# Patient Record
Sex: Female | Born: 1960 | Race: White | Hispanic: No | Marital: Single | Smoking: Former smoker
Health system: Southern US, Community
[De-identification: ages and names within clinical notes are randomized; demographics above are authoritative.]

## PROBLEM LIST (undated history)

## (undated) DIAGNOSIS — Z789 Other specified health status: Secondary | ICD-10-CM

## (undated) HISTORY — PX: BREAST LUMPECTOMY: SHX2

## (undated) HISTORY — PX: BREAST ENHANCEMENT SURGERY: SHX7

## (undated) HISTORY — DX: Other specified health status: Z78.9

## (undated) HISTORY — PX: TONSILLECTOMY: SUR1361

## (undated) HISTORY — PX: TUBAL LIGATION: SHX77

---

## 1999-12-27 HISTORY — PX: AUGMENTATION MAMMAPLASTY: SUR837

## 1999-12-27 HISTORY — PX: BREAST LUMPECTOMY: SHX2

## 2014-12-02 LAB — LAB REPORT - SCANNED
EGFR: 100
Free T4: 0.97 ng/dL

## 2015-09-18 LAB — HM PAP SMEAR: HM Pap smear: NORMAL

## 2015-11-06 LAB — HM MAMMOGRAPHY

## 2017-11-01 ENCOUNTER — Ambulatory Visit (INDEPENDENT_AMBULATORY_CARE_PROVIDER_SITE_OTHER): Payer: BLUE CROSS/BLUE SHIELD | Admitting: Physician Assistant

## 2017-11-01 ENCOUNTER — Encounter: Payer: Self-pay | Admitting: Physician Assistant

## 2017-11-01 VITALS — BP 110/64 | HR 68 | Temp 98.5°F | Resp 16 | Wt 142.0 lb

## 2017-11-01 DIAGNOSIS — Z13220 Encounter for screening for lipoid disorders: Secondary | ICD-10-CM | POA: Diagnosis not present

## 2017-11-01 DIAGNOSIS — Z131 Encounter for screening for diabetes mellitus: Secondary | ICD-10-CM

## 2017-11-01 DIAGNOSIS — Z9889 Other specified postprocedural states: Secondary | ICD-10-CM

## 2017-11-01 DIAGNOSIS — Z862 Personal history of diseases of the blood and blood-forming organs and certain disorders involving the immune mechanism: Secondary | ICD-10-CM

## 2017-11-01 DIAGNOSIS — Z23 Encounter for immunization: Secondary | ICD-10-CM | POA: Diagnosis not present

## 2017-11-01 DIAGNOSIS — Z0000 Encounter for general adult medical examination without abnormal findings: Secondary | ICD-10-CM

## 2017-11-01 DIAGNOSIS — Z1239 Encounter for other screening for malignant neoplasm of breast: Secondary | ICD-10-CM

## 2017-11-01 DIAGNOSIS — Z1231 Encounter for screening mammogram for malignant neoplasm of breast: Secondary | ICD-10-CM | POA: Diagnosis not present

## 2017-11-01 DIAGNOSIS — E079 Disorder of thyroid, unspecified: Secondary | ICD-10-CM

## 2017-11-01 LAB — CBC WITH DIFFERENTIAL/PLATELET
Basophils Absolute: 29 cells/uL (ref 0–200)
Basophils Relative: 0.6 %
Eosinophils Absolute: 172 cells/uL (ref 15–500)
Eosinophils Relative: 3.5 %
HCT: 38.4 % (ref 35.0–45.0)
Hemoglobin: 12.6 g/dL (ref 11.7–15.5)
Lymphs Abs: 1401 cells/uL (ref 850–3900)
MCH: 29.8 pg (ref 27.0–33.0)
MCHC: 32.8 g/dL (ref 32.0–36.0)
MCV: 90.8 fL (ref 80.0–100.0)
MPV: 10 fL (ref 7.5–12.5)
Monocytes Relative: 8.6 %
Neutro Abs: 2876 cells/uL (ref 1500–7800)
Neutrophils Relative %: 58.7 %
Platelets: 266 10*3/uL (ref 140–400)
RBC: 4.23 10*6/uL (ref 3.80–5.10)
RDW: 12 % (ref 11.0–15.0)
Total Lymphocyte: 28.6 %
WBC mixed population: 421 cells/uL (ref 200–950)
WBC: 4.9 10*3/uL (ref 3.8–10.8)

## 2017-11-01 LAB — COMPLETE METABOLIC PANEL WITH GFR
AG Ratio: 1.9 (calc) (ref 1.0–2.5)
ALT: 35 U/L — ABNORMAL HIGH (ref 6–29)
AST: 27 U/L (ref 10–35)
Albumin: 4.1 g/dL (ref 3.6–5.1)
Alkaline phosphatase (APISO): 72 U/L (ref 33–130)
BUN: 17 mg/dL (ref 7–25)
CO2: 30 mmol/L (ref 20–32)
Calcium: 9.5 mg/dL (ref 8.6–10.4)
Chloride: 105 mmol/L (ref 98–110)
Creat: 0.6 mg/dL (ref 0.50–1.05)
GFR, Est African American: 116 mL/min/{1.73_m2} (ref >=60)
GFR, Est Non African American: 100 mL/min/{1.73_m2} (ref >=60)
Globulin: 2.2 g/dL (calc) (ref 1.9–3.7)
Glucose, Bld: 77 mg/dL (ref 65–99)
Potassium: 4 mmol/L (ref 3.5–5.3)
Sodium: 141 mmol/L (ref 135–146)
Total Bilirubin: 0.3 mg/dL (ref 0.2–1.2)
Total Protein: 6.3 g/dL (ref 6.1–8.1)

## 2017-11-01 LAB — LIPID PANEL
Cholesterol: 181 mg/dL (ref <200)
HDL: 65 mg/dL (ref >50)
LDL Cholesterol (Calc): 99 mg/dL (calc)
Non-HDL Cholesterol (Calc): 116 mg/dL (calc) (ref <130)
Total CHOL/HDL Ratio: 2.8 (calc) (ref <5.0)
Triglycerides: 84 mg/dL (ref <150)

## 2017-11-01 LAB — TSH: TSH: 3.16 mIU/L (ref 0.40–4.50)

## 2017-11-01 NOTE — Patient Instructions (Signed)

## 2017-11-01 NOTE — Progress Notes (Signed)
Patient: Amanda Boyd, Female    DOB: Dec 16, 1961, 56 y.o.   MRN: [id number] Visit Date: 11/02/2017  Today's Provider: Trey Sailors, PA-C   Chief Complaint  Patient presents with  . Establish Care  . Annual Exam   Subjective:    Annual physical exam Amanda Boyd is a 56 y.o. female who presents today for health maintenance and complete physical. She feels fairly well. She reports exercising. She reports she is sleeping well.  She moved from [place] in the [facility] area. She is currently living in [place]. She has two dogs, a standard poodle and a bluetick hound.   She is not currently living with anybody. She is divorced, not sexually active. She has two sons and three grandkids.  She has a family history of breast cancer in her sister at age 20. No family history of colon cancer.   She reports having a lumpectomy in her left breast around 2001. She reports it was not cancerous, however she was advised to be on Tamoxifen. She reports she never went on Tamoxifen and got a second opinion, where her scans came back normal. She has been getting mammograms regularly since then with no findings.   Reports last colonoscopy was 2-3 years ago. Last pap 2 years ago. No history of abnormal PAP smear. History of tubal ligation.   She has received a flu shot. Says she has not had tetanus shot since early 2000's. -----------------------------------------------------------------   Review of Systems  Constitutional: Negative.   HENT: Negative.   Eyes: Negative.   Respiratory: Negative.   Cardiovascular: Negative.   Gastrointestinal: Negative.   Endocrine: Negative.   Genitourinary: Negative.   Musculoskeletal: Negative.   Skin: Negative.   Allergic/Immunologic: Negative.   Neurological: Negative.   Hematological: Negative.   Psychiatric/Behavioral: Negative.     Social History      She  reports that she quit smoking about 31 years ago. Her smoking use  included cigarettes. she has never used smokeless tobacco. She reports that she does not drink alcohol or use drugs.       Social History   Socioeconomic History  . Marital status: Single    Spouse name: None  . Number of children: None  . Years of education: None  . Highest education level: None  Social Needs  . Financial resource strain: None  . Food insecurity - worry: None  . Food insecurity - inability: None  . Transportation needs - medical: None  . Transportation needs - non-medical: None  Occupational History  . None  Tobacco Use  . Smoking status: Former Smoker    Types: Cigarettes    Last attempt to quit: 12/26/1985    Years since quitting: 31.8  . Smokeless tobacco: Never Used  Substance and Sexual Activity  . Alcohol use: No    Frequency: Never  . Drug use: No  . Sexual activity: No  Other Topics Concern  . None  Social History Narrative  . None    No past medical history on file.   There are no active problems to display for this patient.   Past Surgical History:  Procedure Laterality Date  . BREAST ENHANCEMENT SURGERY Bilateral   . BREAST LUMPECTOMY Left   . CESAREAN SECTION    . TUBAL LIGATION      Family History        Family Status  Relation Name Status  . Mother  Deceased at age 64  .  Father  Deceased  . Sister  Alive  . Emelda BrothersPat Aunt  Deceased        Her family history includes Breast cancer in her sister; COPD in her father; Diabetes in her paternal aunt.     Allergies  Allergen Reactions  . Morphine And Related      Current Outpatient Medications:  .  FLUARIX QUADRIVALENT 0.5 ML injection, inject 0.5 milliliter intramuscularly, Disp: , Rfl: 0   Patient Care Team: Maryella ShiversPollak, Lateia Fraser M, PA-C as PCP - General (Physician Assistant)      Objective:   Vitals: BP 110/64 (BP Location: Left Arm, Patient Position: Sitting, Cuff Size: Normal)   Pulse 68   Temp 98.5 F (36.9 C) (Oral)   Resp 16   Wt 142 lb (64.4 kg)    Vitals:    11/01/17 1432  BP: 110/64  Pulse: 68  Resp: 16  Temp: 98.5 F (36.9 C)  TempSrc: Oral  Weight: 142 lb (64.4 kg)     Physical Exam  Constitutional: She is oriented to person, place, and time. She appears well-developed and well-nourished.  HENT:  Right Ear: Tympanic membrane and external ear normal.  Left Ear: Tympanic membrane and external ear normal.  Mouth/Throat: Oropharynx is clear and moist. No oropharyngeal exudate.  Neck: Neck supple.  Cardiovascular: Normal rate and regular rhythm.  Pulmonary/Chest: Effort normal and breath sounds normal.  Abdominal: Soft. Bowel sounds are normal. She exhibits no distension. There is no tenderness.  Lymphadenopathy:    She has no cervical adenopathy.  Neurological: She is alert and oriented to person, place, and time.  Skin: Skin is warm and dry.  Psychiatric: She has a normal mood and affect. Her behavior is normal.     Depression Screen PHQ 2/9 Scores 11/01/2017  PHQ - 2 Score 0  PHQ- 9 Score 0      Assessment & Plan:     Routine Health Maintenance and Physical Exam  Exercise Activities and Dietary recommendations Goals    None      Immunization History  Administered Date(s) Administered  . Tdap 11/01/2017    Health Maintenance  Topic Date Due  . Hepatitis C Screening  1961/05/06  . HIV Screening  08/29/1974  . TETANUS/TDAP  08/29/1978  . PAP SMEAR  08/29/1980  . MAMMOGRAM  08/29/2009  . COLONOSCOPY  08/29/2009  . INFLUENZA VACCINE  07/26/2017     Discussed health benefits of physical activity, and encouraged her to engage in regular exercise appropriate for her age and condition.    1. Annual physical exam  Will need to get previous records, including colonoscopy/PAP.  - CBC with Differential - Ambulatory referral to Obstetrics / Gynecology  2. Breast cancer screening  She wants to be referred to GYN.  - MM Digital Screening; Future  3. Diabetes mellitus screening  - Comprehensive Metabolic  Panel (CMET)  4. Thyroid disorder  - TSH  5. Screening cholesterol level  - Lipid Profile  6. History of anemia   7. Need for influenza vaccination  Had flu shot recently.   8. Need for Tdap vaccination  - Tdap vaccine greater than or equal to 7yo IM  9. H/O lumpectomy  Will need to get records.  Return in about 1 year (around 11/01/2018) for CPE.  The entirety of the information documented in the History of Present Illness, Review of Systems and Physical Exam were personally obtained by me. Portions of this information were initially documented by Kavin LeechLaura Walsh, CMA and reviewed  by me for thoroughness and accuracy.    --------------------------------------------------------------------    Trey SailorsAdriana M Samaiyah Howes, PA-C  [facility]

## 2017-11-02 ENCOUNTER — Telehealth: Payer: Self-pay

## 2017-11-02 DIAGNOSIS — Z9889 Other specified postprocedural states: Secondary | ICD-10-CM | POA: Insufficient documentation

## 2017-11-02 NOTE — Telephone Encounter (Signed)
Pt advised.   Thanks,   -Laura  

## 2017-11-02 NOTE — Telephone Encounter (Signed)
-----   Message from Trey SailorsAdriana M Pollak, [place]PA-C sent at 11/02/2017  8:25 AM EST ----- Labs normal.

## 2017-11-08 ENCOUNTER — Encounter: Payer: Self-pay | Admitting: Maternal Newborn

## 2017-11-08 ENCOUNTER — Ambulatory Visit (INDEPENDENT_AMBULATORY_CARE_PROVIDER_SITE_OTHER): Payer: BLUE CROSS/BLUE SHIELD | Admitting: Maternal Newborn

## 2017-11-08 VITALS — BP 90/60 | HR 60 | Ht 67.0 in | Wt 145.0 lb

## 2017-11-08 DIAGNOSIS — Z124 Encounter for screening for malignant neoplasm of cervix: Secondary | ICD-10-CM | POA: Diagnosis not present

## 2017-11-08 DIAGNOSIS — Z01419 Encounter for gynecological examination (general) (routine) without abnormal findings: Secondary | ICD-10-CM

## 2017-11-08 NOTE — Progress Notes (Signed)
p   Gynecology Annual Exam  PCP: Trey SailorsPollak, Adriana M, PA-C  Chief Complaint:  Chief Complaint  Patient presents with  . Gynecologic Exam    LEAKS URINE C COUGHING/SNEEZING    History of Present Illness:Patient is a 56 y.o. G2P2002 presents for annual exam. The patient has no complaints today.   LMP: No LMP recorded. Patient is postmenopausal. Postmenopausal Bleeding: no Postcoital Bleeding: not applicable  The patient is not currently sexually active. She denies dyspareunia. The patient does not perform self breast exams. There is notable family history of breast or ovarian cancer in her family.  The patient wears seatbelts: yes.   The patient has regular exercise: yes.    The patient denies current symptoms of depression.      Review of Systems  Constitutional: Negative for chills, fever and malaise/fatigue.  HENT: Positive for congestion. Negative for ear pain, sinus pain and sore throat.        Seasonal allergies  Eyes: Negative.   Respiratory: Positive for cough. Negative for shortness of breath and wheezing.   Cardiovascular: Negative for chest pain and palpitations.  Gastrointestinal: Negative for abdominal pain, constipation, diarrhea, heartburn and nausea.  Genitourinary: Positive for urgency.       Stress urinary incontinence sometimes with coughing/sneezing  Musculoskeletal: Negative.   Skin: Negative.   Neurological: Negative for dizziness and headaches.  Endo/Heme/Allergies: Negative.   Psychiatric/Behavioral: Negative for depression. The patient is not nervous/anxious.   All other systems reviewed and are negative.   Past Medical History:  No past medical history on file.  Past Surgical History:  Past Surgical History:  Procedure Laterality Date  . BREAST ENHANCEMENT SURGERY Bilateral   . BREAST LUMPECTOMY Left   . CESAREAN SECTION     X2  . TONSILLECTOMY     AGE4-3  . TUBAL LIGATION      Gynecologic History:  No LMP recorded. Patient is  postmenopausal. Last Pap: 2016.  Results were: Normal per patient. Last mammogram: unknown, ordered by PCP but not yet scheduled. Obstetric History: [id number]  Family History:  Family History  Problem Relation Age of Onset  . COPD Father   . Breast cancer Sister [id number]  . Diabetes Paternal Aunt     Social History:  Social History   Socioeconomic History  . Marital status: Single    Spouse name: Not on file  . Number of children: Not on file  . Years of education: Not on file  . Highest education level: Not on file  Social Needs  . Financial resource strain: Not on file  . Food insecurity - worry: Not on file  . Food insecurity - inability: Not on file  . Transportation needs - medical: Not on file  . Transportation needs - non-medical: Not on file  Occupational History  . Not on file  Tobacco Use  . Smoking status: Former Smoker    Types: Cigarettes    Last attempt to quit: 12/26/1985    Years since quitting: 31.8  . Smokeless tobacco: Never Used  Substance and Sexual Activity  . Alcohol use: No    Frequency: Never  . Drug use: No  . Sexual activity: No    Birth control/protection: Post-menopausal  Other Topics Concern  . Not on file  Social History Narrative  . Not on file    Allergies:  Allergies  Allergen Reactions  . Morphine And Related     Medications: Prior to Admission medications   Medication Sig Start Date End Date  Taking? Authorizing Provider  FLUARIX QUADRIVALENT 0.5 ML injection inject 0.5 milliliter intramuscularly 10/05/17   [provider]    Physical Exam Vitals: Blood pressure 90/60, pulse 60, height 5\' 7"  (1.702 m), weight 145 lb (65.8 kg).  General: NAD, cooperative, alert and oriented HEENT: normocephalic, anicteric Thyroid: no enlargement, no palpable nodules Pulmonary: No increased work of breathing, CTAB Cardiovascular: RRR, S1, S2 auscultated, no murmurs, rubs, gallops Breast: Breasts symmetrical, implants palpable  bilaterally, no tenderness, no palpable nodules or masses, no skin or nipple retraction present, no nipple discharge.  No axillary or supraclavicular lymphadenopathy. Abdomen: NABS, soft, non-tender, non-distended.  Umbilicus without lesions.  No hepatomegaly, splenomegaly or masses palpable. No evidence of hernia  Genitourinary:  External: Normal labia majora and minora. Introitus shows some  Atrophy.  Normal urethral meatus, normal Bartholin's and Skene's  glands.    Vagina: Vaginal mucosa somewhat atrophied, no evidence of  prolapse.    Cervix: Grossly normal in appearance, no bleeding  Uterus: Non-enlarged, mobile, normal contour.  No CMT  Adnexa: ovaries non-enlarged, no adnexal masses  Rectal: deferred  Lymphatic: no evidence of inguinal lymphadenopathy Extremities: no edema, erythema, or tenderness Neurologic: Grossly intact Psychiatric: mood appropriate, affect full   Assessment: 56 y.o. G2P2002 routine annual exam  Plan: Problem List Items Addressed This Visit    None    Visit Diagnoses    Women's annual routine gynecological examination    -  Primary   Pap smear for cervical cancer screening       Relevant Orders   Pap IG and HPV (high risk) DNA detection      1) Mammogram - recommend yearly screening mammogram.  Mammogram ordered by PCP, gave her a card for [facility] so she can schedule the appointment.  2) STI screening was offered and declined.  3) ASCCP guidelines and rationale discussed.  Patient opts for every three year screening interval.  4) Osteoporosis  - per USPTF routine screening DEXA at age 56  5) Routine healthcare maintenance including cholesterol, diabetes screening discussed: managed by PCP.  6) Colonoscopy not due, done 2-3 years ago.    7) Follow up 1 year for routine annual exam.  Marcelyn BruinsJacelyn Schmid, CNM 11/08/2017  4:54 PM

## 2017-11-10 LAB — PAP IG AND HPV HIGH-RISK
HPV, high-risk: NEGATIVE
PAP SMEAR COMMENT: 0

## 2017-11-29 ENCOUNTER — Encounter: Payer: Self-pay | Admitting: Physician Assistant

## 2017-11-30 ENCOUNTER — Encounter: Payer: Self-pay | Admitting: Physician Assistant

## 2017-12-13 ENCOUNTER — Encounter: Payer: Self-pay | Admitting: Physician Assistant

## 2018-01-05 ENCOUNTER — Encounter: Payer: Self-pay | Admitting: Physician Assistant

## 2018-04-02 ENCOUNTER — Telehealth: Payer: Self-pay | Admitting: Physician Assistant

## 2018-04-02 NOTE — Telephone Encounter (Signed)
[facility] [place] on 11.7.18

## 2018-04-02 NOTE — Telephone Encounter (Signed)
Faxed [phone] - [facility] to [facility] on 11.7.18

## 2018-04-02 NOTE — Telephone Encounter (Signed)
Faxed ROI to Dr Jamesetta [facility]hyll[facility] [place] [facility] [place] on 11.7.18

## 2018-07-10 ENCOUNTER — Ambulatory Visit
Admission: RE | Admit: 2018-07-10 | Discharge: 2018-07-10 | Disposition: A | Discharge status: Home / Self Care | Payer: BLUE CROSS/BLUE SHIELD | Source: Ambulatory Visit | Attending: Physician Assistant | Admitting: Physician Assistant

## 2018-07-10 ENCOUNTER — Other Ambulatory Visit: Payer: Self-pay | Admitting: Physician Assistant

## 2018-07-10 DIAGNOSIS — Z1231 Encounter for screening mammogram for malignant neoplasm of breast: Secondary | ICD-10-CM | POA: Insufficient documentation

## 2018-07-10 DIAGNOSIS — Z1239 Encounter for other screening for malignant neoplasm of breast: Secondary | ICD-10-CM

## 2018-07-23 ENCOUNTER — Other Ambulatory Visit: Payer: Self-pay | Admitting: *Deleted

## 2018-07-23 ENCOUNTER — Inpatient Hospital Stay
Admission: RE | Admit: 2018-07-23 | Discharge: 2018-07-23 | Disposition: A | Discharge status: Home / Self Care | Payer: Self-pay | Source: Ambulatory Visit | Attending: *Deleted | Admitting: *Deleted

## 2018-07-23 DIAGNOSIS — Z1231 Encounter for screening mammogram for malignant neoplasm of breast: Secondary | ICD-10-CM

## 2018-11-07 ENCOUNTER — Encounter: Payer: Self-pay | Admitting: Physician Assistant

## 2018-11-07 ENCOUNTER — Ambulatory Visit (INDEPENDENT_AMBULATORY_CARE_PROVIDER_SITE_OTHER): Payer: BLUE CROSS/BLUE SHIELD | Admitting: Physician Assistant

## 2018-11-07 VITALS — BP 126/84 | HR 59 | Temp 98.5°F | Wt 163.8 lb

## 2018-11-07 DIAGNOSIS — Z1159 Encounter for screening for other viral diseases: Secondary | ICD-10-CM

## 2018-11-07 DIAGNOSIS — Z0000 Encounter for general adult medical examination without abnormal findings: Secondary | ICD-10-CM | POA: Diagnosis not present

## 2018-11-07 DIAGNOSIS — Z23 Encounter for immunization: Secondary | ICD-10-CM

## 2018-11-07 DIAGNOSIS — F4320 Adjustment disorder, unspecified: Secondary | ICD-10-CM

## 2018-11-07 DIAGNOSIS — Z114 Encounter for screening for human immunodeficiency virus [HIV]: Secondary | ICD-10-CM
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
## 2018-11-08 ENCOUNTER — Telehealth: Payer: Self-pay

## 2018-11-08 ENCOUNTER — Telehealth: Payer: Self-pay | Admitting: Physician Assistant

## 2018-11-08 DIAGNOSIS — Z23 Encounter for immunization: Secondary | ICD-10-CM

## 2018-11-08 LAB — CBC WITH DIFFERENTIAL/PLATELET
Basophils Absolute: 0 10*3/uL (ref 0.0–0.2)
Basos: 1 %
EOS (ABSOLUTE): 0.1 10*3/uL (ref 0.0–0.4)
Eos: 3 %
Hematocrit: 37.4 % (ref 34.0–46.6)
Hemoglobin: 12.7 g/dL (ref 11.1–15.9)
Immature Grans (Abs): 0 10*3/uL (ref 0.0–0.1)
Immature Granulocytes: 0 %
Lymphocytes Absolute: 1.6 10*3/uL (ref 0.7–3.1)
Lymphs: 38 %
MCH: 30.6 pg (ref 26.6–33.0)
MCHC: 34 g/dL (ref 31.5–35.7)
MCV: 90 fL (ref 79–97)
Monocytes Absolute: 0.5 10*3/uL (ref 0.1–0.9)
Monocytes: 12 %
Neutrophils Absolute: 1.9 10*3/uL (ref 1.4–7.0)
Neutrophils: 46 %
Platelets: 270 10*3/uL (ref 150–450)
RBC: 4.15 x10E6/uL (ref 3.77–5.28)
RDW: 11.8 % — ABNORMAL LOW (ref 12.3–15.4)
WBC: 4.2 10*3/uL (ref 3.4–10.8)

## 2018-11-08 LAB — COMPREHENSIVE METABOLIC PANEL
ALT: 25 IU/L (ref 0–32)
AST: 24 IU/L (ref 0–40)
Albumin/Globulin Ratio: 2 (ref 1.2–2.2)
Albumin: 4.2 g/dL (ref 3.5–5.5)
Alkaline Phosphatase: 81 IU/L (ref 39–117)
BUN/Creatinine Ratio: 28 — ABNORMAL HIGH (ref 9–23)
BUN: 15 mg/dL (ref 6–24)
Bilirubin Total: <0.2 mg/dL (ref 0.0–1.2)
CO2: 23 mmol/L (ref 20–29)
Calcium: 9.2 mg/dL (ref 8.7–10.2)
Chloride: 104 mmol/L (ref 96–106)
Creatinine, Ser: 0.53 mg/dL — ABNORMAL LOW (ref 0.57–1.00)
GFR calc Af Amer: 120 mL/min/{1.73_m2} (ref >59)
GFR calc non Af Amer: 104 mL/min/{1.73_m2} (ref >59)
Globulin, Total: 2.1 g/dL (ref 1.5–4.5)
Glucose: 85 mg/dL (ref 65–99)
Potassium: 4 mmol/L (ref 3.5–5.2)
Sodium: 141 mmol/L (ref 134–144)
Total Protein: 6.3 g/dL (ref 6.0–8.5)

## 2018-11-08 LAB — LIPID PANEL
Chol/HDL Ratio: 3.1 ratio (ref 0.0–4.4)
Cholesterol, Total: 185 mg/dL (ref 100–199)
HDL: 59 mg/dL (ref >39)
LDL Calculated: 113 mg/dL — ABNORMAL HIGH (ref 0–99)
Triglycerides: 63 mg/dL (ref 0–149)
VLDL Cholesterol Cal: 13 mg/dL (ref 5–40)

## 2018-11-08 LAB — HEMOGLOBIN A1C
Est. average glucose Bld gHb Est-mCnc: 108 mg/dL
Hgb A1c MFr Bld: 5.4 % (ref 4.8–5.6)

## 2018-11-08 LAB — HEPATITIS C ANTIBODY: Hep C Virus Ab: <0.1 s/co ratio (ref 0.0–0.9)

## 2018-11-08 LAB — TSH: TSH: 4.44 u[IU]/mL (ref 0.450–4.500)

## 2018-11-08 LAB — HIV ANTIBODY (ROUTINE TESTING W REFLEX): HIV Screen 4th Generation wRfx: NONREACTIVE

## 2018-11-08 NOTE — Telephone Encounter (Signed)
Patient was given Shingrix, labels are posted on ummunization consent. Patient advised. Patient requested a copy of consent. Please advise. sd

## 2018-11-08 NOTE — Telephone Encounter (Signed)
Do not see orders for flu shot. I see flu injection under her medications from 10/05/2017. Influenza vaccine not administered, only shingrix. She may have copy of consent form.

## 2018-11-08 NOTE — Telephone Encounter (Signed)
Patient advised as below.  

## 2018-11-08 NOTE — Telephone Encounter (Signed)
Pt says she had her flu shot on 10-01-18. However, her paperwork from yesterday's visit said she received another flu shot.  She came to the visit to get the shingrix shot.  Pt is very concerned.  Please call her back asap.  Thanks, [employer]

## 2018-11-08 NOTE — Telephone Encounter (Signed)
-----   Message from Trey SailorsAdriana M Pollak, [place]-C sent at 11/08/2018  9:19 AM EST ----- CBC normal. CMET without diabetes, normal kidney and liver function. A1c normal. Cholesterol well controlled. HIV and Hep C negative

## 2018-11-14 NOTE — Patient Instructions (Signed)
Adjustment Disorder, Adult Adjustment disorder is a group of symptoms that can develop after a stressful life event, such as the loss of a job or serious physical illness. The symptoms can affect how you feel, think, and act. They may interfere with your relationships. Adjustment disorder increases your risk of suicide and substance abuse. If this disorder is not managed early, it can develop into a more serious condition, such as major depressive disorder or post-traumatic stress disorder. What are the causes? This condition happens when you have trouble recovering from or coping with a stressful life event. What increases the risk? You are more likely to develop this condition if:  You have had depression or anxiety.  You are being treated for a long-term (chronic) illness.  You are being treated for an illness that cannot be cured (terminal illness).  You have a family history of mental illness.  What are the signs or symptoms? Symptoms of this condition include:  Extreme trouble doing daily tasks, such as going to work.  Sadness, depression, or crying spells.  Worrying a lot.  Loss of enjoyment.  Change in appetite or weight.  Feelings of loss or hopelessness.  Thoughts of suicide.  Anxiety, worry, or nervousness.  Trouble sleeping.  Avoiding family and friends.  Fighting or vandalism.  Complaining of feeling sick without being ill.  Feeling dazed or disconnected.  Nightmares.  Trouble sleeping.  Irritability.  Reckless driving.  Poor work performance.  Ignoring bills.  Symptoms of this condition start within three months of the stressful event. They do not last more than six months, unless the stressful circumstances last longer. Normal grieving after the death of a loved one is not a symptom of this condition. How is this diagnosed? To diagnose this condition, your health care provider will ask about what has happened in your life and how it has  affected you. He or she may also ask about your medical history and your use of medicines, alcohol, and other substances. Your health care provider may do a physical exam and order lab tests or other studies. You may be referred to a mental health specialist. How is this treated? Treatment options for this condition include:  Counseling or talk therapy. Talk therapy is usually provided by mental health specialists.  Medicines. Certain medicines may help with depression, anxiety, and sleep.  Support groups. These offer emotional support, advice, and guidance. They are made up of people who have had similar experiences.  Observation and time. This is sometimes called "watchful waiting." In this treatment, health care providers monitor your health and behavior without other treatment. Adjustment disorder sometimes gets better on its own with time.  Follow these instructions at home:  Take over-the-counter and prescription medicines only as told by your health care provider.  Keep all follow-up visits as told by your health care provider. This is important. Contact a health care provider if:  Your symptoms do not improve in six months.  Your symptoms get worse. Get help right away if:  You have serious thoughts about hurting yourself or someone else. If you ever feel like you may hurt yourself or others, or have thoughts about taking your own life, get help right away. You can go to your nearest emergency department or call:  Your local emergency services (911 in the [place]  A suicide crisis helpline, such as the National Suicide Prevention Lifeline at 1-800-273-8255. This is open 24 hours a day.  Summary  Adjustment disorder is a group of   symptoms that can develop after a stressful life event, such as the loss of a job or serious physical illness. The symptoms can affect how you feel, think, and act. They may interfere with your relationships.  Symptoms of this condition start within  three months of the stressful event. They do not last more than six months, unless the stressful circumstances last longer.  Treatment may include talk therapy, medicines, participation in a support group, or observation to see if symptoms improve.  Contact your health care provider if your symptoms get worse or do not improve in six months.  If you ever feel like you may hurt yourself or others, or have thoughts about taking your own life, get help right away. This information is not intended to replace advice given to you by your health care provider. Make sure you discuss any questions you have with your health care provider. Document Released: 08/16/2006 Document Revised: 02/10/2017 Document Reviewed: 02/10/2017 Elsevier Interactive Patient Education  2018 [employer]vier [employer].  

## 2018-12-03 ENCOUNTER — Telehealth: Payer: Self-pay | Admitting: Physician Assistant

## 2018-12-03 NOTE — Telephone Encounter (Signed)
Patient was given the number...

## 2018-12-03 NOTE — Telephone Encounter (Signed)
Pt needing to know the number to the counselor Ricki Rodriguezdriana had given her.  Please advise.  Thanks, [employer]

## 2018-12-03 NOTE — Telephone Encounter (Signed)
I don't believe I gave her a phone # for counseling. In my note, I placed referral to psychology which patient declined to schedule due to being unavailable 8-5. If she would like to see somebody in [place] I can place a new referral. The number for [facility] is [phone] if she would like to try calling herself.

## 2019-01-02 ENCOUNTER — Ambulatory Visit: Payer: BLUE CROSS/BLUE SHIELD | Admitting: Psychology

## 2019-01-07 ENCOUNTER — Ambulatory Visit: Payer: BLUE CROSS/BLUE SHIELD | Admitting: Psychology

## 2019-01-09 ENCOUNTER — Encounter: Payer: Self-pay | Admitting: Physician Assistant

## 2019-01-09 ENCOUNTER — Ambulatory Visit (INDEPENDENT_AMBULATORY_CARE_PROVIDER_SITE_OTHER): Payer: BLUE CROSS/BLUE SHIELD | Admitting: Physician Assistant

## 2019-01-09 VITALS — BP 83/56 | HR 67 | Temp 98.3°F | Resp 16 | Wt 156.0 lb

## 2019-01-09 DIAGNOSIS — R002 Palpitations: Secondary | ICD-10-CM | POA: Diagnosis not present

## 2019-01-09 DIAGNOSIS — F4322 Adjustment disorder with anxiety: Secondary | ICD-10-CM

## 2019-01-09 DIAGNOSIS — Z23 Encounter for immunization: Secondary | ICD-10-CM

## 2019-01-09 NOTE — Progress Notes (Signed)
Patient: Amanda Boyd Female    DOB: May 16, 1961   58 y.o.   MRN: [id number] Visit Date: 01/11/2019  Today's Provider: Trey Sailors, PA-C   Chief Complaint  Patient presents with  . Follow-up   Subjective:     HPI  Follow up for anxiety  The patient was last seen for this 1 months ago. Changes made at last visit include referral for counseling.  She has not been able to attend counseling due to leaving her job and getting a new health insurance that does not pay for counseling. She declined medication and continues to do so. She says she has gotten a lot stronger in her faith and has joined a church group that has frequent social gatherings and she plans to do this.  She feels that condition is Improved. She is not having side effects.   BP Readings from Last 3 Encounters:  01/09/19 (!) 83/56  11/07/18 126/84  11/08/17 90/60    ------------------------------------------------------------------------------------ Patient c/o heart "fluttering" x's 10 days. Patient reports symptom is on and off. Denies chest pain, SOB. Denies syncope during these episodes. Denies chest pain that worsens with activity and improves with rest. Has had this in the past, patient reports she can't remember what they said. Sister said she had two stents placed.    Allergies  Allergen Reactions  . Morphine And Related     No current outpatient medications on file.  Review of Systems  Constitutional: Negative.   Respiratory: Negative.   Cardiovascular: Positive for palpitations.    Social History   Tobacco Use  . Smoking status: Former Smoker    Types: Cigarettes    Last attempt to quit: 12/26/1985    Years since quitting: 33.0  . Smokeless tobacco: Never Used  Substance Use Topics  . Alcohol use: No    Frequency: Never      Objective:   BP (!) 83/56 (BP Location: Left Arm, Patient Position: Sitting, Cuff Size: Normal)   Pulse 67   Temp 98.3 F (36.8 C) (Oral)   Resp 16    Wt 156 lb (70.8 kg)   BMI 24.43 kg/m  Vitals:   01/09/19 1457  BP: (!) 83/56  Pulse: 67  Resp: 16  Temp: 98.3 F (36.8 C)  TempSrc: Oral  Weight: 156 lb (70.8 kg)     Physical Exam Constitutional:      Appearance: Normal appearance.  Cardiovascular:     Rate and Rhythm: Normal rate and regular rhythm.     Heart sounds: Normal heart sounds.  Pulmonary:     Effort: Pulmonary effort is normal.     Breath sounds: Normal breath sounds.  Skin:    General: Skin is warm and dry.  Neurological:     General: No focal deficit present.     Mental Status: She is alert and oriented to person, place, and time.  Psychiatric:        Mood and Affect: Mood normal.        Behavior: Behavior normal.         Assessment & Plan    1. Adjustment disorder with anxious mood  Feels comfort in her new church group, declines medication and counseling at this point. May consider in the future when she gets health insurance again.   2. Need for shingles vaccine  - Varicella-zoster vaccine IM  3. Palpitations  Sound benign in nature, offered EKG though patient declines.  Return if symptoms worsen or  fail to improve.   The entirety of the information documented in the History of Present Illness, Review of Systems and Physical Exam were personally obtained by me. Portions of this information were initially documented by Rondel Baton, CMA and reviewed by me for thoroughness and accuracy.    Trey Sailors, PA-C  [facility]

## 2019-01-11 NOTE — Patient Instructions (Signed)

## 2019-01-14 ENCOUNTER — Ambulatory Visit: Payer: BLUE CROSS/BLUE SHIELD | Admitting: Psychology

## 2019-11-08 ENCOUNTER — Other Ambulatory Visit: Payer: Self-pay

## 2019-11-11 ENCOUNTER — Other Ambulatory Visit: Payer: Self-pay | Admitting: *Deleted

## 2019-11-11 ENCOUNTER — Encounter: Payer: Self-pay | Admitting: *Deleted

## 2019-11-11 DIAGNOSIS — Z0000 Encounter for general adult medical examination without abnormal findings: Secondary | ICD-10-CM

## 2019-11-11 NOTE — Progress Notes (Signed)
Spoke to patient today to review appropriateness for going directly for her mammogram via [facility] visit tomorrow due to [place].  Patient identified using 2 identifiers.  Verbal consent given.  She denies any breast problems.  I did review her Tyrer-Cuzick score of 19.6%.  She has a history of a benign biopsy, and mom had breast cancer in her 2's.  Explained need to continue to have annual screening but no modified screening or genetic testing recommended.  States her sister does have some type of genetic mutation associated with clotting.  I have put in a screening mammogram order.   Last pap on 11/08/17 was negative / negative.  Next pap due in 2023.

## 2019-11-12 ENCOUNTER — Other Ambulatory Visit: Payer: Self-pay

## 2019-11-12 ENCOUNTER — Other Ambulatory Visit: Payer: Self-pay | Admitting: *Deleted

## 2019-11-12 ENCOUNTER — Ambulatory Visit
Admission: RE | Admit: 2019-11-12 | Discharge: 2019-11-12 | Disposition: A | Discharge status: Home / Self Care | Payer: Self-pay | Source: Ambulatory Visit | Attending: Oncology | Admitting: Oncology

## 2019-11-12 ENCOUNTER — Ambulatory Visit: Payer: Self-pay | Attending: Oncology

## 2019-11-12 DIAGNOSIS — Z0000 Encounter for general adult medical examination without abnormal findings: Secondary | ICD-10-CM | POA: Insufficient documentation

## 2019-11-13 NOTE — Progress Notes (Signed)
Virtual [facility] visit completed by [employer].  Patient screened, and meets [facility] eligibility.  Patient does not have insurance, Medicare or Medicaid. Will have bilateral screening mammogram at [facility].  Orders are in. Virtual consent signed. Risk Assessment    No risk assessment data for the current encounter   Risk Scores      11/08/2019   Last edited by: Orson Slick, CMA   5-year risk: 3 %   Lifetime risk: 14.9 %

## 2019-11-13 NOTE — Progress Notes (Signed)
Letter mailed from [facility] to notify of normal mammogram results.  Patient to return in one year for annual screening.  Copy to [facility]. 

## 2020-11-02 ENCOUNTER — Ambulatory Visit (LOCAL_COMMUNITY_HEALTH_CENTER): Payer: Self-pay

## 2020-11-02 ENCOUNTER — Other Ambulatory Visit: Payer: Self-pay

## 2020-11-02 DIAGNOSIS — Z23 Encounter for immunization: Secondary | ICD-10-CM

## 2020-11-02 NOTE — Progress Notes (Signed)
Flu vaccine given; tolerated well Shakai Dolley, RN  

## 2020-11-18 ENCOUNTER — Encounter: Payer: Self-pay | Admitting: *Deleted

## 2020-11-18 ENCOUNTER — Ambulatory Visit
Admission: RE | Admit: 2020-11-18 | Discharge: 2020-11-18 | Disposition: A | Discharge status: Home / Self Care | Payer: Self-pay | Source: Ambulatory Visit | Attending: Oncology | Admitting: Oncology

## 2020-11-18 ENCOUNTER — Ambulatory Visit: Payer: Self-pay | Attending: Oncology | Admitting: *Deleted

## 2020-11-18 ENCOUNTER — Other Ambulatory Visit: Payer: Self-pay

## 2020-11-18 ENCOUNTER — Other Ambulatory Visit: Payer: Self-pay | Admitting: *Deleted

## 2020-11-18 VITALS — BP 97/69 | HR 70 | Temp 98.2°F | Resp 16 | Wt 159.6 lb

## 2020-11-18 DIAGNOSIS — Z0000 Encounter for general adult medical examination without abnormal findings: Secondary | ICD-10-CM

## 2020-11-18 NOTE — Progress Notes (Signed)
°  Subjective:     Patient ID: Amanda Boyd, female   DOB: [birth year], 59 y.o.   MRN: [id number]  HPI   [facility] Medical History Record - 11/18/20 1114      Breast History   Screening cycle New    Provider ([facility]) Beverly Gust    Initial Mammogram 11/18/20    Last Mammogram Annual    Last Mammogram Date 11/12/19    Provider (Mammogram)  Delford Field    Recent Breast Symptoms None      Breast Cancer History   Breast Cancer History Patient and mother/daughter/sister have had breast cancer    Comments/Details Sister dx with breast cancer age 49      Previous History of Breast Problems   Breast Surgery or Biopsy Left   2000; benign   Breast Implants --   bilateral breast implants   BSE Done Monthly      Gynecological/Obstetrical History   LMP --   age 43   Is there any chance that the client could be pregnant?  No    Age at menarche 38    Age at menopause 32    PAP smear history Annually    Date of last PAP  11/08/17   neg/neg   Provider (PAP) [facility]    Age at first live birth 51    Breast fed children Yes (type length in comments)   8 months   DES Exposure Unkown    Cervical, Uterine or Ovarian cancer No    Family history of Cervial, Uterine or Ovarian cancer No    Hysterectomy No    Cervix removed No    Ovaries removed No    Laser/Cryosurgery No    Current method of birth control None    Current method of Estrogen/Hormone replacement None    Smoking history None   quit at age 48            Review of Systems     Objective:   Physical Exam Chest:     Breasts:        Right: No swelling, bleeding, inverted nipple, mass, nipple discharge, skin change or tenderness.        Left: No swelling, bleeding, inverted nipple, mass, nipple discharge, skin change or tenderness.       Comments: Bilateral implants - pt states behind the muscle Lymphadenopathy:     Upper Body:     Right upper body: No supraclavicular or axillary adenopathy.     Left upper body: No  supraclavicular or axillary adenopathy.        Assessment:     59 year old female returns to [facility] for annual screening.  Clinical breast exam unremarkable.  Patient has bilateral implants.  Taught self breast awareness.  Last pap on 11/08/17 was negative / negative.  Next pap due in 2023.  Patient has been screened for eligibility.  She does not have any insurance, Medicare or Medicaid.  She also meets financial eligibility.   Risk Assessment    Risk Scores      11/18/2020 11/08/2019   Last edited by: Scarlett Presto, RN [place], Freada Bergeron, CMA   5-year risk: 3.1 % 3 %   Lifetime risk: 14.5 % 14.9 %            Plan:     Screening mammogram ordered.  Will follow up per [facility] protocol.

## 2020-11-18 NOTE — Patient Instructions (Signed)
Gave patient hand-out, Women Staying Healthy, Active and Well from [facility], with education on breast health, pap smears, heart and colon health. 

## 2020-11-26 ENCOUNTER — Encounter: Payer: Self-pay | Admitting: *Deleted

## 2020-11-26 NOTE — Progress Notes (Signed)
Letter mailed from [facility] to inform patient of her normal mammogram results.  Patient is to follow-up with annual screening in one year. 

## 2021-12-31 ENCOUNTER — Telehealth: Payer: Self-pay

## 2021-12-31 NOTE — Telephone Encounter (Signed)
Patient advised to schedule an appt due to last visit being 01/09/19. Copied from CRM [id number]. Topic: General - Other >> Dec 31, 2021  2:22 PM Randol Kern wrote: Reason for CRM: Pt needs mammogram, please advise   [phone]

## 2022-01-11 ENCOUNTER — Ambulatory Visit (INDEPENDENT_AMBULATORY_CARE_PROVIDER_SITE_OTHER): Payer: 59 | Admitting: Family Medicine

## 2022-01-11 ENCOUNTER — Encounter: Payer: Self-pay | Admitting: Family Medicine

## 2022-01-11 ENCOUNTER — Other Ambulatory Visit: Payer: Self-pay

## 2022-01-11 VITALS — BP 106/63 | HR 76 | Temp 98.2°F | Resp 16 | Ht 67.0 in | Wt 161.0 lb

## 2022-01-11 DIAGNOSIS — Z780 Asymptomatic menopausal state: Secondary | ICD-10-CM

## 2022-01-11 DIAGNOSIS — Z0000 Encounter for general adult medical examination without abnormal findings: Secondary | ICD-10-CM | POA: Diagnosis not present

## 2022-01-11 DIAGNOSIS — Z124 Encounter for screening for malignant neoplasm of cervix: Secondary | ICD-10-CM | POA: Diagnosis not present

## 2022-01-11 DIAGNOSIS — Z23 Encounter for immunization: Secondary | ICD-10-CM | POA: Diagnosis not present

## 2022-01-11 DIAGNOSIS — E2839 Other primary ovarian failure: Secondary | ICD-10-CM | POA: Diagnosis not present

## 2022-01-11 NOTE — Patient Instructions (Addendum)
It was great to see you!  Our plans for today:  - Bring a copy of your living will by the clinic so we can have this on your chart. - We will try to get records from your previous colonoscopy. - Someone will call you with an appointment for your mammogram, bone density scan, and gynecology appointment.   We are checking some labs today, we will release these results to your MyChart.  Take care and seek immediate care sooner if you develop any concerns.   Dr. Linwood Dibbles  Things to do to keep yourself healthy  - Exercise at least 30-45 minutes a day, 3-4 days a week.  - Eat a low-fat diet with lots of fruits and vegetables, up to 7-9 servings per day.  - Seatbelts can save your life. Wear them always.  - Smoke detectors on every level of your home, check batteries every year.  - Eye Doctor - have an eye exam every 1-2 years  - Safe sex - if you may be exposed to STDs, use a condom.  - Alcohol -  If you drink, do it moderately, less than 2 drinks per day.  - Health Care Power of Attorney. Choose someone to speak for you if you are not able. https://www.prepareforyourcare.org is a great website to help you navigate this. - Depression is common in our stressful world.If you're feeling down or losing interest in things you normally enjoy, please come in for a visit.  - Violence - If anyone is threatening or hurting you, please call immediately.

## 2022-01-11 NOTE — Progress Notes (Signed)
BP 106/63 (BP Location: Left Arm, Patient Position: Sitting, Cuff Size: Normal)    Pulse 76    Temp 98.2 F (36.8 C) (Temporal)    Resp 16    Ht 5\' 7"  (1.702 m)    Wt 161 lb (73 kg)    SpO2 97%    BMI 25.22 kg/m    Subjective:    Patient ID: Amanda Boyd, female    DOB: [birth year], 61 y.o.   MRN: [id number]  HPI: Amanda Boyd is a 61 y.o. female presenting on 01/11/2022 for comprehensive medical examination. Current medical complaints include:none  Menopausal Symptoms: no  Depression Screen done today and results listed below:  Depression screen [facility] 2/9 01/11/2022 11/07/2018 11/07/2018 11/01/2017  Decreased Interest 1 0 0 0  Down, Depressed, Hopeless 0 0 0 0  PHQ - 2 Score 1 0 0 0  Altered sleeping 0 0 - 0  Tired, decreased energy 1 0 - 0  Change in appetite 1 3 - 0  Feeling bad or failure about yourself  0 0 - 0  Trouble concentrating 0 0 - 0  Moving slowly or fidgety/restless 0 0 - 0  Suicidal thoughts 0 0 - 0  PHQ-9 Score 3 3 - 0  Difficult doing work/chores Not difficult at all Not difficult at all - Not difficult at all    Past Medical History:  History reviewed. No pertinent past medical history.  Surgical History:  Past Surgical History:  Procedure Laterality Date   AUGMENTATION MAMMAPLASTY Bilateral 2001   saline   BREAST ENHANCEMENT SURGERY Bilateral    BREAST LUMPECTOMY Left 2001   benign   CESAREAN SECTION     X2   TONSILLECTOMY     AGE4-5   TUBAL LIGATION      Medications:  No current outpatient medications on file prior to visit.   No current facility-administered medications on file prior to visit.    Allergies:  Allergies  Allergen Reactions   Morphine And Related     Social History:  Social History   Socioeconomic History   Marital status: Single    Spouse name: Not on file   Number of children: Not on file   Years of education: Not on file   Highest education level: Not on file  Occupational History   Not on file  Tobacco Use    Smoking status: Former    Types: Cigarettes    Quit date: 12/26/1985    Years since quitting: 36.0   Smokeless tobacco: Never  Vaping Use   Vaping Use: Never used  Substance and Sexual Activity   Alcohol use: No   Drug use: No   Sexual activity: Never    Birth control/protection: Post-menopausal  Other Topics Concern   Not on file  Social History Narrative   Not on file   Social Determinants of Health   Financial Resource Strain: Not on file  Food Insecurity: Not on file  Transportation Needs: Not on file  Physical Activity: Not on file  Stress: Not on file  Social Connections: Not on file  Intimate Partner Violence: Not on file   Social History   Tobacco Use  Smoking Status Former   Types: Cigarettes   Quit date: 12/26/1985   Years since quitting: 36.0  Smokeless Tobacco Never   Social History   Substance and Sexual Activity  Alcohol Use No    Family History:  Family History  Problem Relation Age of Onset   COPD Father  Breast cancer Sister 39   Diabetes Paternal Aunt     Past medical history, surgical history, medications, allergies, family history and social history reviewed with patient today and changes made to appropriate areas of the chart.      Objective:    BP 106/63 (BP Location: Left Arm, Patient Position: Sitting, Cuff Size: Normal)    Pulse 76    Temp 98.2 F (36.8 C) (Temporal)    Resp 16    Ht 5\' 7"  (1.702 m)    Wt 161 lb (73 kg)    SpO2 97%    BMI 25.22 kg/m   Wt Readings from Last 3 Encounters:  01/11/22 161 lb (73 kg)  11/18/20 159 lb 9.6 oz (72.4 kg)  01/09/19 156 lb (70.8 kg)    Physical Exam Vitals reviewed.  Constitutional:      Appearance: Normal appearance. She is normal weight. She is not ill-appearing.  HENT:     Head: Normocephalic.     Right Ear: External ear normal.     Left Ear: External ear normal.  Eyes:     Extraocular Movements: Extraocular movements intact.  Cardiovascular:     Rate and Rhythm: Normal rate and  regular rhythm.     Heart sounds: Normal heart sounds. No murmur heard. Pulmonary:     Effort: Pulmonary effort is normal. No respiratory distress.     Breath sounds: Normal breath sounds.  Abdominal:     General: Bowel sounds are normal.     Palpations: Abdomen is soft.     Tenderness: There is no abdominal tenderness.  Musculoskeletal:        General: Normal range of motion.     Right lower leg: No edema.     Left lower leg: No edema.  Skin:    General: Skin is warm and dry.  Neurological:     Mental Status: She is alert and oriented to person, place, and time. Mental status is at baseline.     Gait: Gait normal.  Psychiatric:        Mood and Affect: Mood normal.        Behavior: Behavior normal.    Results for orders placed or performed in visit on 11/07/18  CBC with Differential/Platelet  Result Value Ref Range   WBC 4.2 3.4 - 10.8 x10E3/uL   RBC 4.15 3.77 - 5.28 x10E6/uL   Hemoglobin 12.7 11.1 - 15.9 g/dL   Hematocrit 47.8 29.5 - 46.6 %   MCV 90 79 - 97 fL   MCH 30.6 26.6 - 33.0 pg   MCHC 34.0 31.5 - 35.7 g/dL   RDW 62.1 (L) 30.8 - 65.7 %   Platelets 270 150 - 450 x10E3/uL   Neutrophils 46 Not Estab. %   Lymphs 38 Not Estab. %   Monocytes 12 Not Estab. %   Eos 3 Not Estab. %   Basos 1 Not Estab. %   Neutrophils Absolute 1.9 1.4 - 7.0 x10E3/uL   Lymphocytes Absolute 1.6 0.7 - 3.1 x10E3/uL   Monocytes Absolute 0.5 0.1 - 0.9 x10E3/uL   EOS (ABSOLUTE) 0.1 0.0 - 0.4 x10E3/uL   Basophils Absolute 0.0 0.0 - 0.2 x10E3/uL   Immature Granulocytes 0 Not Estab. %   Immature Grans (Abs) 0.0 0.0 - 0.1 x10E3/uL  Comprehensive metabolic panel  Result Value Ref Range   Glucose 85 65 - 99 mg/dL   BUN 15 6 - 24 mg/dL   Creatinine, Ser 8.46 (L) 0.57 - 1.00 mg/dL  GFR calc non Af Amer 104 >59 mL/min/1.73   GFR calc Af Amer 120 >59 mL/min/1.73   BUN/Creatinine Ratio 28 (H) 9 - 23   Sodium 141 134 - 144 mmol/L   Potassium 4.0 3.5 - 5.2 mmol/L   Chloride 104 96 - 106 mmol/L    CO2 23 20 - 29 mmol/L   Calcium 9.2 8.7 - 10.2 mg/dL   Total Protein 6.3 6.0 - 8.5 g/dL   Albumin 4.2 3.5 - 5.5 g/dL   Globulin, Total 2.1 1.5 - 4.5 g/dL   Albumin/Globulin Ratio 2.0 1.2 - 2.2   Bilirubin Total <0.2 0.0 - 1.2 mg/dL   Alkaline Phosphatase 81 39 - 117 IU/L   AST 24 0 - 40 IU/L   ALT 25 0 - 32 IU/L  Hemoglobin A1c  Result Value Ref Range   Hgb A1c MFr Bld 5.4 4.8 - 5.6 %   Est. average glucose Bld gHb Est-mCnc 108 mg/dL  Lipid panel  Result Value Ref Range   Cholesterol, Total 185 100 - 199 mg/dL   Triglycerides 63 0 - 149 mg/dL   HDL 59 >39 mg/dL   VLDL Cholesterol Cal 13 5 - 40 mg/dL   LDL Calculated 113 (H) 0 - 99 mg/dL   Chol/HDL Ratio 3.1 0.0 - 4.4 ratio  TSH  Result Value Ref Range   TSH 4.440 0.450 - 4.500 uIU/mL  HIV antibody (with reflex)  Result Value Ref Range   HIV Screen 4th Generation wRfx Non Reactive Non Reactive  Hepatitis C antibody  Result Value Ref Range   Hep C Virus Ab <0.1 0.0 - 0.9 s/co ratio      Assessment & Plan:   Problem List Items Addressed This Visit   None Visit Diagnoses     Annual physical exam    -  Primary   Relevant Orders   Comprehensive metabolic panel   CBC   Lipid panel   Hemoglobin A1c   MM DIGITAL SCREENING BILATERAL   DG Bone Density   Screening for cervical cancer       Relevant Orders   Ambulatory referral to Gynecology   Estrogen deficiency       Relevant Orders   DG Bone Density   Post-menopausal       Relevant Orders   DG Bone Density   Need for influenza vaccination       Relevant Orders   Flu Vaccine QUAD 66mo+IM (Fluarix, Fluzone & Alfiuria Quad PF) (Completed)        Follow up plan: No follow-ups on file.   LABORATORY TESTING:  - Pap smear: up to date  IMMUNIZATIONS:   - Tdap: Tetanus vaccination status reviewed: last tetanus booster within 10 years. - Influenza: Administered today - Pneumococcal: Not applicable - HPV: Not applicable - Shingrix vaccine: Up to date - COVID  vaccine: has received 2 doses of mRNA vaccine  SCREENING: - Mammogram: Up to date  - Colonoscopy:  per patient done at [facility] in 2016. Normal per patient. Will obtain records. - Bone Density: Ordered today  - Lung Cancer Screening: Not applicable   Hep C Screening: UTD STD testing and prevention (HIV/chl/gon/syphilis): no concerns Sexual History: not sexually active Menstrual History/LMP/Abnormal Bleeding: post-menopausal Incontinence Symptoms: none  Osteoporosis: Discussed high calcium and vitamin D supplementation, weight bearing exercises  Advanced Care Planning: A voluntary discussion about advance care planning including the explanation and discussion of advance directives.  Discussed health care proxy and Living will, and the  patient was able to identify a health care proxy as sons, Alaisha Botero and Margia Schmidt.  Patient does have a living will at present time. If patient does have living will, I have requested they bring this to the clinic to be scanned in to their chart.  PATIENT COUNSELING:   Advised to take 1 mg of folate supplement per day if capable of pregnancy.   Sexuality: Discussed sexually transmitted diseases, partner selection, use of condoms, avoidance of unintended pregnancy  and contraceptive alternatives.   Advised to avoid cigarette smoking.  I discussed with the patient that most people either abstain from alcohol or drink within safe limits (<=14/week and <=4 drinks/occasion for males, <=7/weeks and <= 3 drinks/occasion for females) and that the risk for alcohol disorders and other health effects rises proportionally with the number of drinks per week and how often a drinker exceeds daily limits.  Discussed cessation/primary prevention of drug use and availability of treatment for abuse.   Diet: Encouraged to adjust caloric intake to maintain  or achieve ideal body weight, to reduce intake of dietary saturated fat and total fat, to limit sodium  intake by avoiding high sodium foods and not adding table salt, and to maintain adequate dietary potassium and calcium preferably from fresh fruits, vegetables, and low-fat dairy products.    Stressed the importance of regular exercise.  Injury prevention: Discussed safety belts, safety helmets, smoke detector, smoking near bedding or upholstery.   Dental health: Discussed importance of regular tooth brushing, flossing, and dental visits.    NEXT PREVENTATIVE PHYSICAL DUE IN 1 YEAR. No follow-ups on file.

## 2022-01-13 ENCOUNTER — Telehealth: Payer: Self-pay

## 2022-01-13 DIAGNOSIS — Z1231 Encounter for screening mammogram for malignant neoplasm of breast: Secondary | ICD-10-CM

## 2022-01-13 NOTE — Telephone Encounter (Signed)
Copied from CRM [id number]. Topic: General - Other >> Jan 13, 2022 11:24 AM Maye Hides wrote: Reason for CRM: Per Delford Field all mammogram must be 3D or TOMO. Can some fix order,Thanks

## 2022-02-03 ENCOUNTER — Encounter: Payer: Self-pay | Admitting: Obstetrics and Gynecology

## 2022-02-03 ENCOUNTER — Other Ambulatory Visit (HOSPITAL_COMMUNITY)
Admission: RE | Admit: 2022-02-03 | Discharge: 2022-02-03 | Disposition: A | Discharge status: Home / Self Care | Payer: 59 | Source: Ambulatory Visit | Attending: Obstetrics and Gynecology | Admitting: Obstetrics and Gynecology

## 2022-02-03 ENCOUNTER — Other Ambulatory Visit: Payer: Self-pay

## 2022-02-03 ENCOUNTER — Ambulatory Visit (INDEPENDENT_AMBULATORY_CARE_PROVIDER_SITE_OTHER): Payer: 59 | Admitting: Obstetrics and Gynecology

## 2022-02-03 VITALS — BP 110/70 | Ht 67.0 in | Wt 157.0 lb

## 2022-02-03 DIAGNOSIS — Z124 Encounter for screening for malignant neoplasm of cervix: Secondary | ICD-10-CM | POA: Insufficient documentation

## 2022-02-03 DIAGNOSIS — R69 Illness, unspecified: Secondary | ICD-10-CM | POA: Diagnosis not present

## 2022-02-03 DIAGNOSIS — Z1151 Encounter for screening for human papillomavirus (HPV): Secondary | ICD-10-CM | POA: Insufficient documentation

## 2022-02-03 DIAGNOSIS — Z01419 Encounter for gynecological examination (general) (routine) without abnormal findings: Secondary | ICD-10-CM

## 2022-02-03 DIAGNOSIS — Z1211 Encounter for screening for malignant neoplasm of colon: Secondary | ICD-10-CM

## 2022-02-03 DIAGNOSIS — N3942 Incontinence without sensory awareness: Secondary | ICD-10-CM | POA: Diagnosis not present

## 2022-02-03 DIAGNOSIS — N393 Stress incontinence (female) (male): Secondary | ICD-10-CM

## 2022-02-03 DIAGNOSIS — Z1231 Encounter for screening mammogram for malignant neoplasm of breast: Secondary | ICD-10-CM | POA: Diagnosis not present

## 2022-02-03 NOTE — Progress Notes (Signed)
PCP: Trinna Post, PA-C (Inactive)   Chief Complaint  Patient presents with   Gynecologic Exam    HPI:      [name] is a 61 y.o. G2P2002 whose LMP was No LMP recorded. Patient is postmenopausal., presents today for her NP > 3 yrs annual examination.  Her menses are absent due to menopause, no PMB. She does not have vasomotor sx.   Sex activity: not sex active in many yrs; she does not have vaginal dryness.   Last Pap: 11/08/17 Results were: no abnormalities /neg HPV DNA.  Hx of STDs: none  Last mammogram: 11/18/20 Results were: normal--routine follow-up in 12 months; has mammo scheds 3/23 There is a FH of breast cancer in her sister, genetic testing not indicated. There is no FH of ovarian cancer. The patient does do self-breast exams. S/p lumpectomy 2001, benign  Colonoscopy: ~6-7 yrs ago in [facility]; Repeat due after 10 years per pt.  DEXA 3/23 with PCP  Tobacco use: The patient denies current or previous tobacco use. Alcohol use: none No drug use Exercise: moderately active  She does get adequate calcium and Vitamin D in her diet.  Labs with PCP.  Issues with SUI as well as leaking urine without sensory awareness. No urge incontinence per se. Drinks 2 caffeine drinks and lots of water. Noticed sx increase with 2nd caffeine drink. Wearing pads daily. Not doing pelvic floor exercises   Patient Active Problem List   Diagnosis Date Noted   SUI (stress urinary incontinence, female) 02/03/2022   Urinary incontinence without sensory awareness 02/03/2022   H/O lumpectomy 11/02/2017    Past Surgical History:  Procedure Laterality Date   AUGMENTATION MAMMAPLASTY Bilateral 2001   saline   BREAST ENHANCEMENT SURGERY Bilateral    BREAST LUMPECTOMY Left 2001   benign   CESAREAN SECTION     X2   TONSILLECTOMY     AGE4-5   TUBAL LIGATION      Family History  Problem Relation Age of Onset   COPD Father    Breast cancer Sister 28   Diabetes Paternal Aunt      Social History   Socioeconomic History   Marital status: Single    Spouse name: Not on file   Number of children: Not on file   Years of education: Not on file   Highest education level: Not on file  Occupational History   Not on file  Tobacco Use   Smoking status: Former    Types: Cigarettes    Quit date: 12/26/1985    Years since quitting: 36.1   Smokeless tobacco: Never  Vaping Use   Vaping Use: Never used  Substance and Sexual Activity   Alcohol use: No   Drug use: No   Sexual activity: Not Currently    Birth control/protection: Post-menopausal  Other Topics Concern   Not on file  Social History Narrative   Not on file   Social Determinants of Health   Financial Resource Strain: Not on file  Food Insecurity: Not on file  Transportation Needs: Not on file  Physical Activity: Not on file  Stress: Not on file  Social Connections: Not on file  Intimate Partner Violence: Not on file    No current outpatient medications on file.     ROS:  Review of Systems  Constitutional:  Negative for fatigue, fever and unexpected weight change.  Respiratory:  Negative for cough, shortness of breath and wheezing.   Cardiovascular:  Negative for chest  pain, palpitations and leg swelling.  Gastrointestinal:  Negative for blood in stool, constipation, diarrhea, nausea and vomiting.  Endocrine: Negative for cold intolerance, heat intolerance and polyuria.  Genitourinary:  Positive for difficulty urinating and frequency. Negative for dyspareunia, dysuria, flank pain, genital sores, hematuria, menstrual problem, pelvic pain, urgency, vaginal bleeding, vaginal discharge and vaginal pain.  Musculoskeletal:  Negative for back pain, joint swelling and myalgias.  Skin:  Negative for rash.  Neurological:  Negative for dizziness, syncope, light-headedness, numbness and headaches.  Hematological:  Negative for adenopathy.  Psychiatric/Behavioral:  Negative for agitation, confusion,  sleep disturbance and suicidal ideas. The patient is not nervous/anxious.   BREAST: No symptoms    Objective: BP 110/70    Ht 5\' 7"  (1.702 m)    Wt 157 lb (71.2 kg)    BMI 24.59 kg/m    Physical Exam Constitutional:      Appearance: She is well-developed.  Genitourinary:     Vulva normal.     Right Labia: No rash, tenderness or lesions.    Left Labia: No tenderness, lesions or rash.    No vaginal discharge, erythema or tenderness.      Right Adnexa: not tender and no mass present.    Left Adnexa: not tender and no mass present.    No cervical friability or polyp.     Uterus is not enlarged or tender.  Breasts:    Right: No mass, nipple discharge, skin change or tenderness.     Left: No mass, nipple discharge, skin change or tenderness.  Neck:     Thyroid: No thyromegaly.  Cardiovascular:     Rate and Rhythm: Normal rate and regular rhythm.     Heart sounds: Normal heart sounds. No murmur heard. Pulmonary:     Effort: Pulmonary effort is normal.     Breath sounds: Normal breath sounds.  Abdominal:     Palpations: Abdomen is soft.     Tenderness: There is no abdominal tenderness. There is no guarding or rebound.  Musculoskeletal:        General: Normal range of motion.     Cervical back: Normal range of motion.  Lymphadenopathy:     Cervical: No cervical adenopathy.  Neurological:     General: No focal deficit present.     Mental Status: She is alert and oriented to person, place, and time.     Cranial Nerves: No cranial nerve deficit.  Skin:    General: Skin is warm and dry.  Psychiatric:        Mood and Affect: Mood normal.        Behavior: Behavior normal.        Thought Content: Thought content normal.        Judgment: Judgment normal.  Vitals reviewed.    Assessment/Plan:  Encounter for annual routine gynecological examination  Cervical cancer screening - Plan: Cytology - PAP  Screening for HPV (human papillomavirus) - Plan: Cytology - PAP  Encounter  for screening mammogram for malignant neoplasm of breast; pt has mammo appt  Screening for colon cancer--pt to find out date of colonoscopy. Has signed med release with PCP.   SUI (stress urinary incontinence, female) - Plan: Ambulatory referral to Urology; add kegels, d/c caffeine  Urinary incontinence without sensory awareness - Plan: Ambulatory referral to Urology; d/c caffeine, refer to urology.          GYN counsel mammography screening, menopause, adequate intake of calcium and vitamin D, diet and exercise  F/U  Return in about 1 year (around 02/03/2023).  Atha Muradyan B. Rylyn Ranganathan, PA-C 02/03/2022 5:23 PM

## 2022-02-07 LAB — CYTOLOGY - PAP
Comment: NEGATIVE
Diagnosis: NEGATIVE
High risk HPV: NEGATIVE

## 2022-02-08 ENCOUNTER — Encounter: Payer: Self-pay | Admitting: *Deleted

## 2022-03-07 ENCOUNTER — Ambulatory Visit: Payer: Self-pay

## 2022-03-23 ENCOUNTER — Ambulatory Visit
Admission: RE | Admit: 2022-03-23 | Discharge: 2022-03-23 | Disposition: A | Discharge status: Home / Self Care | Payer: 59 | Source: Ambulatory Visit | Attending: Family Medicine | Admitting: Family Medicine

## 2022-03-23 ENCOUNTER — Other Ambulatory Visit: Payer: Self-pay

## 2022-03-23 DIAGNOSIS — Z1231 Encounter for screening mammogram for malignant neoplasm of breast: Secondary | ICD-10-CM | POA: Diagnosis not present

## 2022-03-31 ENCOUNTER — Telehealth: Payer: Self-pay | Admitting: Obstetrics and Gynecology

## 2022-03-31 NOTE — Telephone Encounter (Signed)
Pt has called in about a billing question.  Saw [facility] on 03-04-2022.  The visit was coded incorrectly and she has been billed for it.  Wanting to know if it can be billed as a preventative screening.  Pt would like for you to give her a call.  ?

## 2022-05-03 ENCOUNTER — Telehealth: Payer: Self-pay

## 2022-05-03 NOTE — Telephone Encounter (Signed)
Pt called triage following up on her visit from 02-26-22 it was coded as preventative and should of been screening. The account number is [id number]. Pt is aware you are out of the office   ?

## 2022-05-30 NOTE — Telephone Encounter (Signed)
Pt aware.

## 2023-01-16 IMAGING — MG DIGITAL SCREENING BREAST BILAT IMPLANT W/ TOMO W/ CAD
9 of 14 series · 9 of 34 positions shown · non-contrast
Comparison: Previous exam(s).

CLINICAL DATA: Screening.

EXAM:
DIGITAL SCREENING BILATERAL MAMMOGRAM WITH IMPLANTS, CAD AND
TOMOSYNTHESIS
TECHNIQUE: Bilateral screening digital craniocaudal and mediolateral oblique
mammograms were obtained. Bilateral screening digital breast
tomosynthesis was performed. The images were evaluated with
computer-aided detection. Standard and/or implant displaced views
were performed.

[R CC]
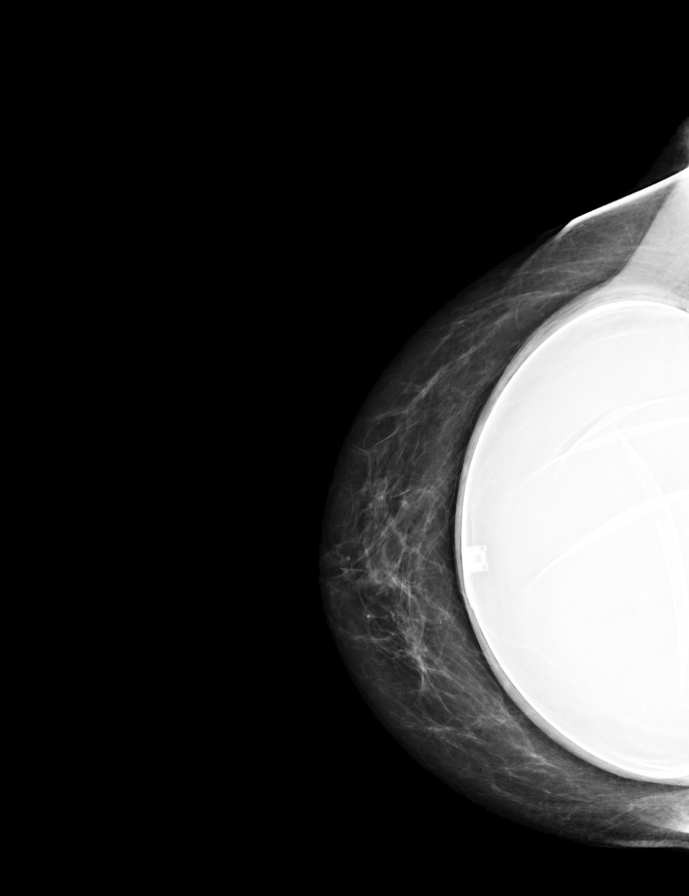

[L CC]
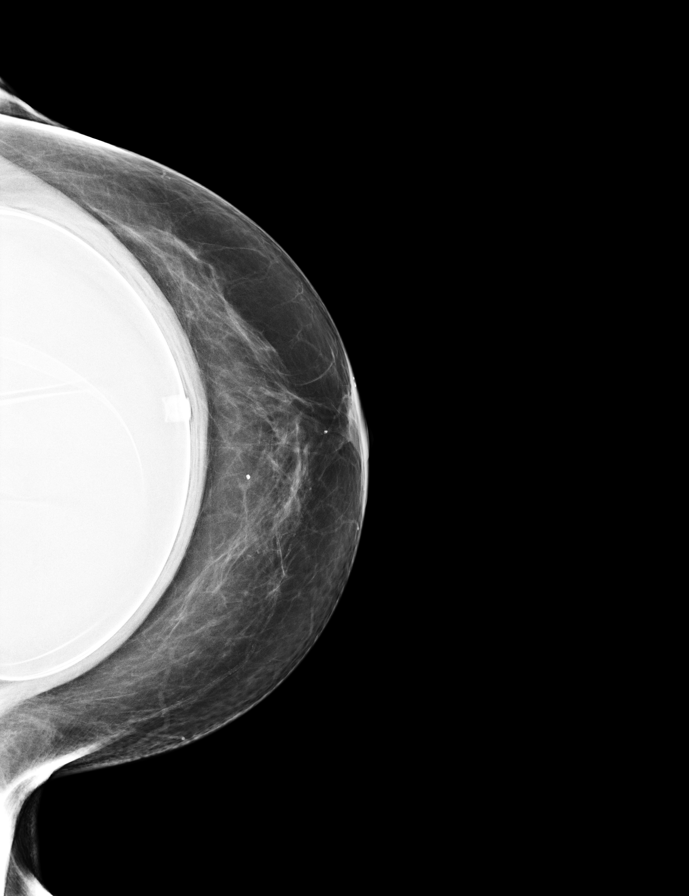

[R MLO]
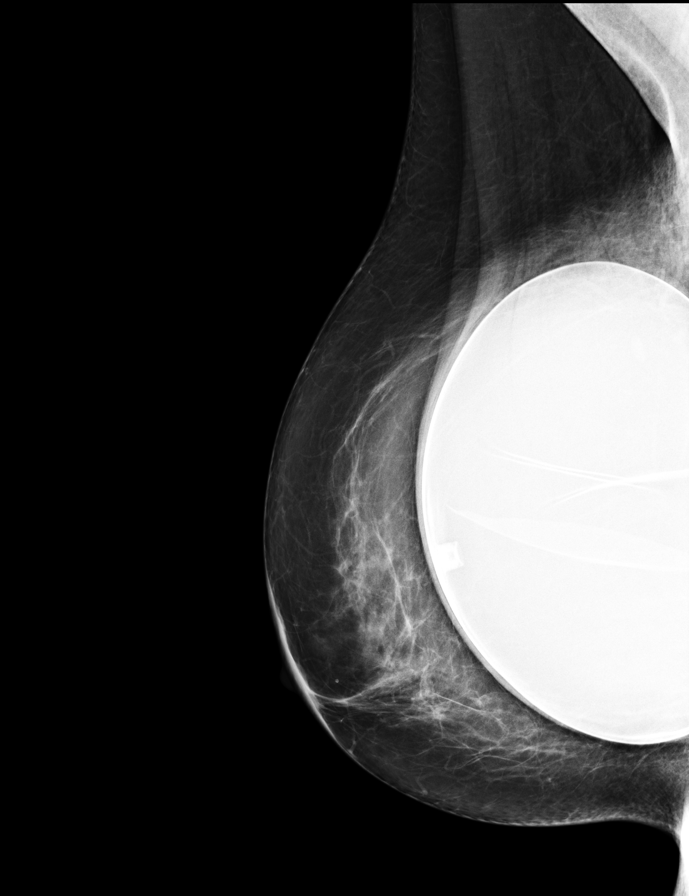

[L MLO]
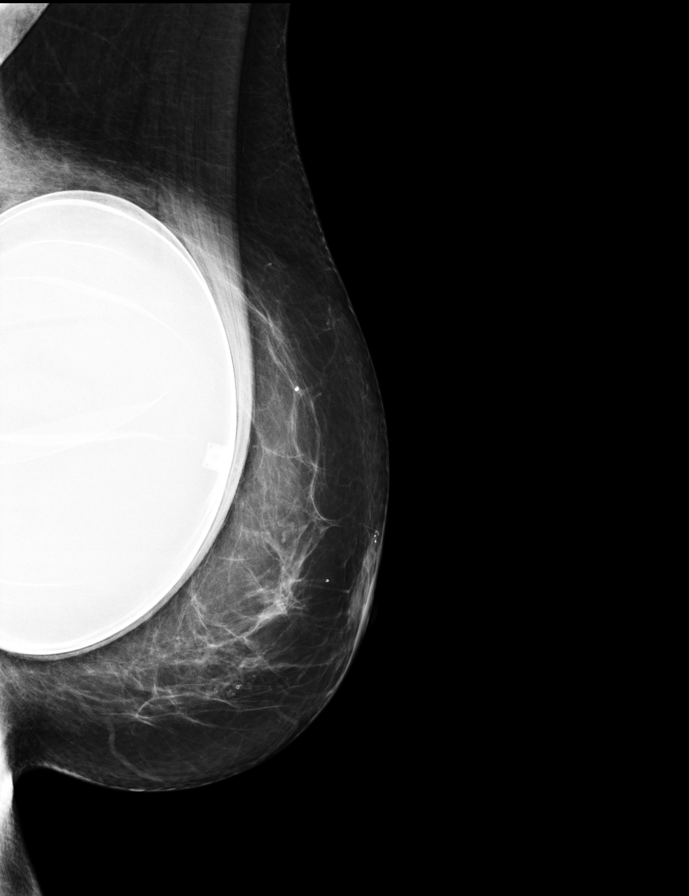

[R MLO synth-2D (1 of 2)]
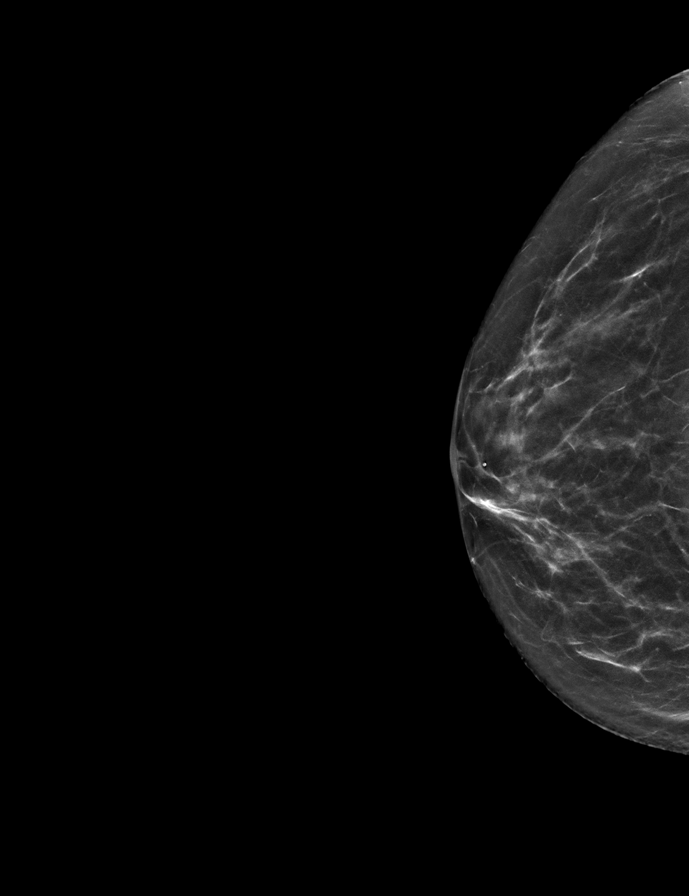

[R MLO synth-2D (2 of 2)]
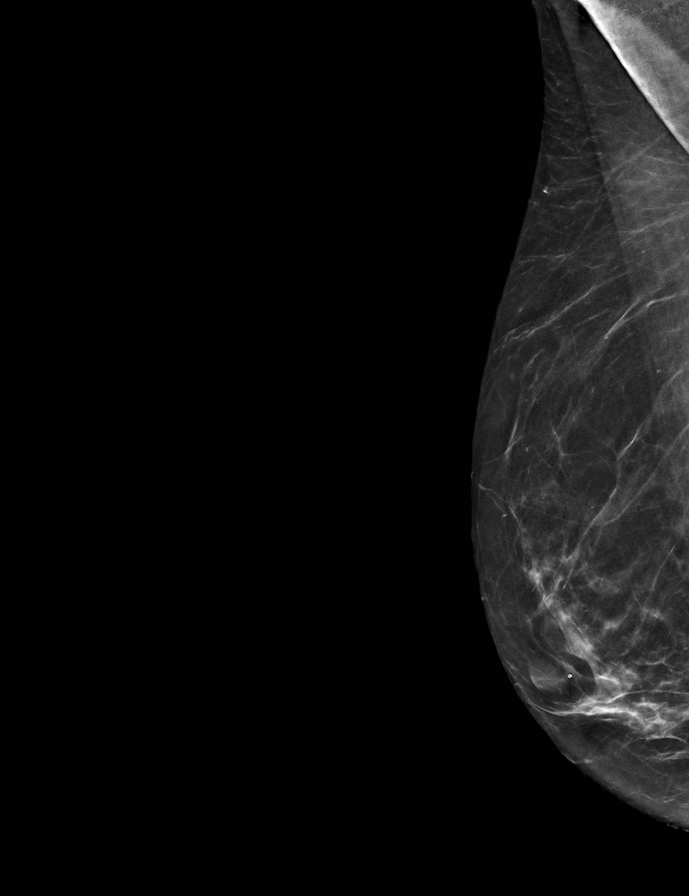

[L MLO synth-2D]
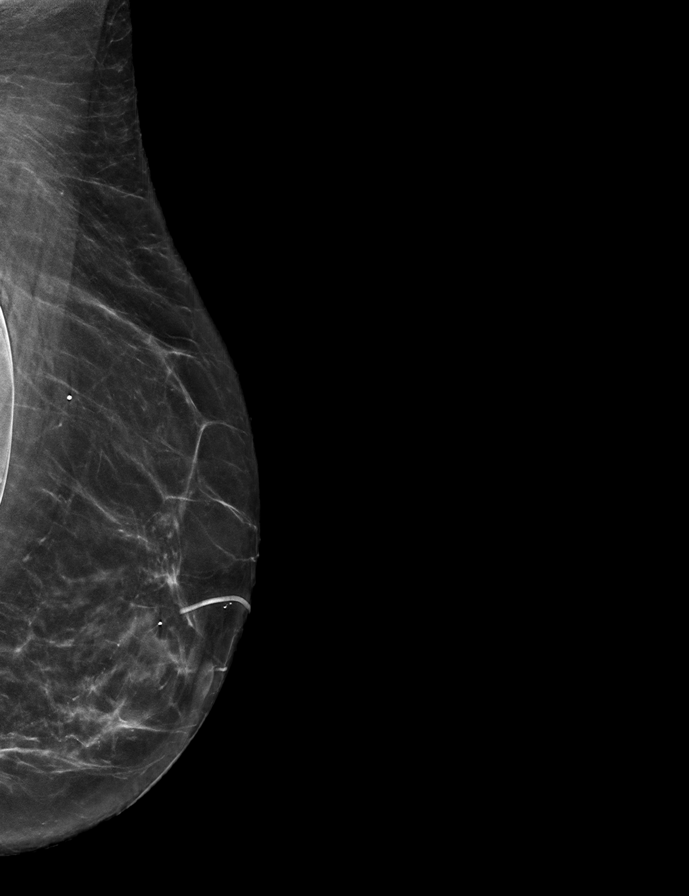

[L CC synth-2D]
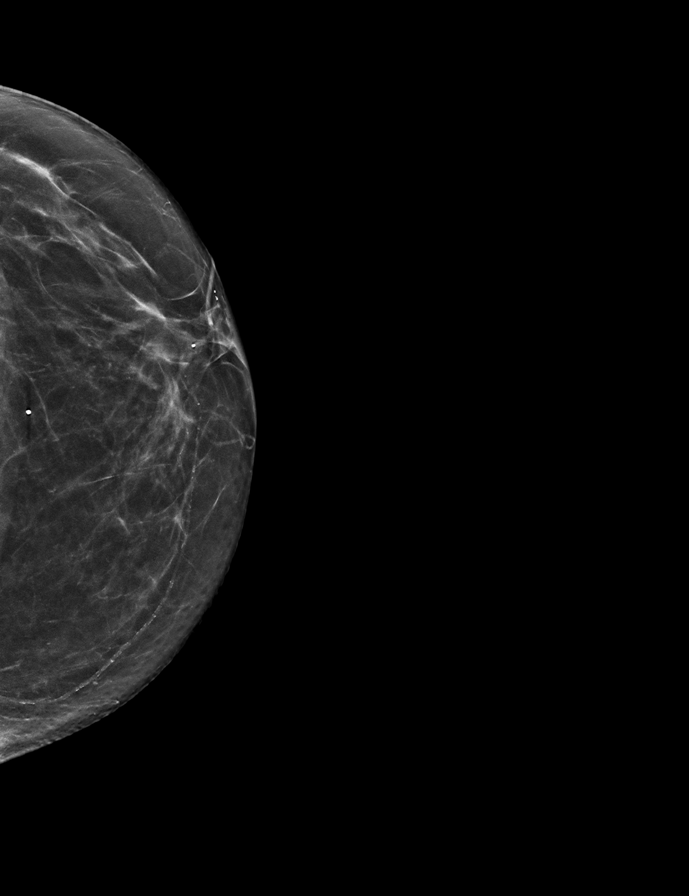

[R CC synth-2D]
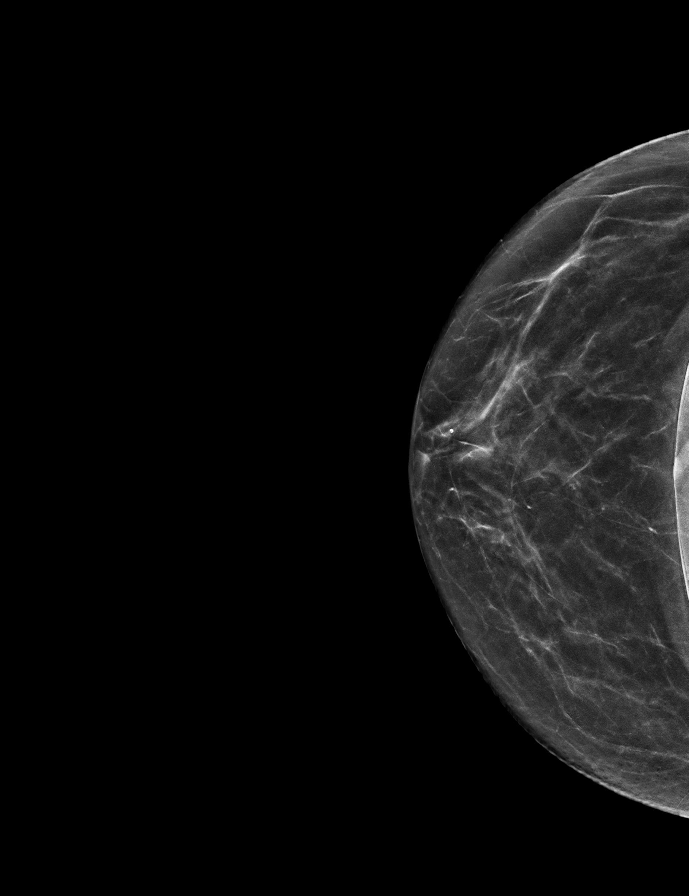

[9 of 34 positions shown; findings below may reference images not displayed]

ACR Breast Density Category b: There are scattered areas of
fibroglandular density.
FINDINGS: The patient has retropectoral saline implants. There are no findings
suspicious for malignancy.
IMPRESSION: No mammographic evidence of malignancy. A result letter of this
screening mammogram will be mailed directly to the patient.

RECOMMENDATION:
Screening mammogram in one year. (Code:NC-6-TAT)

BI-RADS CATEGORY  1:  Negative.

## 2023-01-17 ENCOUNTER — Encounter: Payer: 59 | Admitting: Family Medicine

## 2023-04-26 NOTE — Telephone Encounter (Signed)
Error

## 2023-05-28 DIAGNOSIS — J069 Acute upper respiratory infection, unspecified: Secondary | ICD-10-CM | POA: Diagnosis not present

## 2023-05-28 DIAGNOSIS — R059 Cough, unspecified: Secondary | ICD-10-CM | POA: Diagnosis not present

## 2023-05-29 DIAGNOSIS — H9203 Otalgia, bilateral: Secondary | ICD-10-CM | POA: Diagnosis not present

## 2023-05-29 DIAGNOSIS — J069 Acute upper respiratory infection, unspecified: Secondary | ICD-10-CM | POA: Diagnosis not present

## 2023-05-29 DIAGNOSIS — Z6823 Body mass index (BMI) 23.0-23.9, adult: Secondary | ICD-10-CM | POA: Diagnosis not present

## 2023-07-29 DIAGNOSIS — Z791 Long term (current) use of non-steroidal anti-inflammatories (NSAID): Secondary | ICD-10-CM | POA: Diagnosis not present

## 2023-07-29 DIAGNOSIS — Z885 Allergy status to narcotic agent status: Secondary | ICD-10-CM | POA: Diagnosis not present

## 2023-07-29 DIAGNOSIS — Z809 Family history of malignant neoplasm, unspecified: Secondary | ICD-10-CM | POA: Diagnosis not present

## 2023-07-29 DIAGNOSIS — H269 Unspecified cataract: Secondary | ICD-10-CM | POA: Diagnosis not present

## 2023-07-29 DIAGNOSIS — I10 Essential (primary) hypertension: Secondary | ICD-10-CM | POA: Diagnosis not present

## 2023-07-29 DIAGNOSIS — Z008 Encounter for other general examination: Secondary | ICD-10-CM | POA: Diagnosis not present

## 2023-07-29 DIAGNOSIS — Z833 Family history of diabetes mellitus: Secondary | ICD-10-CM | POA: Diagnosis not present

## 2023-07-29 DIAGNOSIS — J45909 Unspecified asthma, uncomplicated: Secondary | ICD-10-CM | POA: Diagnosis not present

## 2023-07-29 DIAGNOSIS — N393 Stress incontinence (female) (male): Secondary | ICD-10-CM | POA: Diagnosis not present

## 2023-08-16 ENCOUNTER — Other Ambulatory Visit: Payer: Self-pay | Admitting: Obstetrics and Gynecology

## 2023-08-16 DIAGNOSIS — Z1231 Encounter for screening mammogram for malignant neoplasm of breast: Secondary | ICD-10-CM

## 2023-09-05 DIAGNOSIS — M2242 Chondromalacia patellae, left knee: Secondary | ICD-10-CM | POA: Diagnosis not present

## 2023-10-17 ENCOUNTER — Encounter: Payer: Self-pay | Admitting: Obstetrics and Gynecology

## 2023-10-17 ENCOUNTER — Ambulatory Visit (INDEPENDENT_AMBULATORY_CARE_PROVIDER_SITE_OTHER): Payer: 59 | Admitting: Obstetrics and Gynecology

## 2023-10-17 VITALS — BP 108/70 | Ht 67.0 in | Wt 159.0 lb

## 2023-10-17 DIAGNOSIS — Z01419 Encounter for gynecological examination (general) (routine) without abnormal findings: Secondary | ICD-10-CM | POA: Diagnosis not present

## 2023-10-17 DIAGNOSIS — Z0000 Encounter for general adult medical examination without abnormal findings: Secondary | ICD-10-CM

## 2023-10-17 DIAGNOSIS — Z1231 Encounter for screening mammogram for malignant neoplasm of breast: Secondary | ICD-10-CM

## 2023-10-17 NOTE — Progress Notes (Signed)
PCP: Caro Laroche, DO   Chief Complaint  Patient presents with   Gynecologic Exam    No concerns    HPI:      [name] is a 62 y.o. [phone] whose LMP was No LMP recorded. Patient is postmenopausal., presents today for her annual examination.  Her menses are absent due to menopause, no PMB. She does not have vasomotor sx.   Sex activity: not sex active in many yrs; she does not have vaginal dryness/sx.   Last Pap: 2/29/23 Results were: no abnormalities /neg HPV DNA.  Hx of STDs: none  Last mammogram: 03/23/22 Results were: normal--routine follow-up in 12 months; has mammo scheduled 11/24 There is a FH of breast cancer in her sister, genetic testing not indicated. There is no FH of ovarian cancer. The patient does do self-breast exams. S/p lumpectomy 2001, benign  Colonoscopy: ~7-8 yrs ago in [facility]; Repeat due after 10 years per pt.  DEXA 3/23 with PCP  Tobacco use: The patient denies current or previous tobacco use. Alcohol use: none No drug use Exercise: min active  She does get adequate calcium but not Vitamin D in her diet.  No recent labs with PCP. Not fasting today. Wants to get new PCP  SUI sx from last yr resolved. Affected by caffeine intake.    Patient Active Problem List   Diagnosis Date Noted   SUI (stress urinary incontinence, female) 02/03/2022   Urinary incontinence without sensory awareness 02/03/2022   H/O lumpectomy 11/02/2017    Past Surgical History:  Procedure Laterality Date   AUGMENTATION MAMMAPLASTY Bilateral 2001   saline   BREAST ENHANCEMENT SURGERY Bilateral    BREAST LUMPECTOMY Left 2001   benign   CESAREAN SECTION     X2   TONSILLECTOMY     AGE4-5   TUBAL LIGATION      Family History  Problem Relation Age of Onset   COPD Father    Breast cancer Sister 72   Diabetes Paternal Aunt     Social History   Socioeconomic History   Marital status: Single    Spouse name: Not on file   Number of children: Not on file    Years of education: Not on file   Highest education level: Not on file  Occupational History   Not on file  Tobacco Use   Smoking status: Former    Current packs/day: 0.00    Types: Cigarettes    Quit date: 12/26/1985    Years since quitting: 37.8   Smokeless tobacco: Never  Vaping Use   Vaping status: Never Used  Substance and Sexual Activity   Alcohol use: No   Drug use: No   Sexual activity: Not Currently    Birth control/protection: Post-menopausal  Other Topics Concern   Not on file  Social History Narrative   Not on file   Social Determinants of Health   Financial Resource Strain: Not on file  Food Insecurity: Not on file  Transportation Needs: Not on file  Physical Activity: Not on file  Stress: Not on file  Social Connections: Not on file  Intimate Partner Violence: Not on file    No current outpatient medications on file.     ROS:  Review of Systems  Constitutional:  Negative for fatigue, fever and unexpected weight change.  Respiratory:  Negative for cough, shortness of breath and wheezing.   Cardiovascular:  Negative for chest pain, palpitations and leg swelling.  Gastrointestinal:  Negative for blood  in stool, constipation, diarrhea, nausea and vomiting.  Endocrine: Negative for cold intolerance, heat intolerance and polyuria.  Genitourinary:  Negative for difficulty urinating, dyspareunia, dysuria, flank pain, frequency, genital sores, hematuria, menstrual problem, pelvic pain, urgency, vaginal bleeding, vaginal discharge and vaginal pain.  Musculoskeletal:  Negative for back pain, joint swelling and myalgias.  Skin:  Negative for rash.  Neurological:  Negative for dizziness, syncope, light-headedness, numbness and headaches.  Hematological:  Negative for adenopathy.  Psychiatric/Behavioral:  Negative for agitation, confusion, sleep disturbance and suicidal ideas. The patient is not nervous/anxious.    BREAST: No symptoms    Objective: BP  108/70   Ht 5\' 7"  (1.702 m)   Wt 159 lb (72.1 kg)   BMI 24.90 kg/m    Physical Exam Constitutional:      Appearance: She is well-developed.  Genitourinary:     Vulva normal.     Right Labia: No rash, tenderness or lesions.    Left Labia: No tenderness, lesions or rash.    No vaginal discharge, erythema or tenderness.     Severe vaginal atrophy present.     Right Adnexa: not tender and no mass present.    Left Adnexa: not tender and no mass present.    No cervical friability or polyp.     Uterus is not enlarged or tender.  Breasts:    Right: No mass, nipple discharge, skin change or tenderness.     Left: No mass, nipple discharge, skin change or tenderness.  Neck:     Thyroid: No thyromegaly.  Cardiovascular:     Rate and Rhythm: Normal rate and regular rhythm.     Heart sounds: Normal heart sounds. No murmur heard. Pulmonary:     Effort: Pulmonary effort is normal.     Breath sounds: Normal breath sounds.  Abdominal:     Palpations: Abdomen is soft.     Tenderness: There is no abdominal tenderness. There is no guarding or rebound.  Musculoskeletal:        General: Normal range of motion.     Cervical back: Normal range of motion.  Lymphadenopathy:     Cervical: No cervical adenopathy.  Neurological:     General: No focal deficit present.     Mental Status: She is alert and oriented to person, place, and time.     Cranial Nerves: No cranial nerve deficit.  Skin:    General: Skin is warm and dry.  Psychiatric:        Mood and Affect: Mood normal.        Behavior: Behavior normal.        Thought Content: Thought content normal.        Judgment: Judgment normal.  Vitals reviewed.     Assessment/Plan:  Encounter for annual routine gynecological examination  Encounter for screening mammogram for malignant neoplasm of breast; pt has upcoming mammo appt  Blood tests for routine general physical examination - Plan: Comprehensive metabolic panel, CBC with  Differential/Platelet'        GYN counsel mammography screening, menopause, adequate intake of calcium and vitamin D, diet and exercise      F/U  Return in about 1 year (around 10/16/2024).  Inessa Wardrop B. Asiana Benninger, PA-C 10/17/2023 4:14 PM

## 2023-10-17 NOTE — Patient Instructions (Signed)
I value your feedback and you entrusting us with your care. If you get a [facility] patient survey, I would appreciate you taking the time to let us know about your experience today. Thank you! ? ? ?

## 2023-10-18 LAB — CBC WITH DIFFERENTIAL/PLATELET
Basophils Absolute: 0 10*3/uL (ref 0.0–0.2)
Basos: 1 %
EOS (ABSOLUTE): 0.2 10*3/uL (ref 0.0–0.4)
Eos: 4 %
Hematocrit: 39.4 % (ref 34.0–46.6)
Hemoglobin: 12.5 g/dL (ref 11.1–15.9)
Immature Grans (Abs): 0 10*3/uL (ref 0.0–0.1)
Immature Granulocytes: 0 %
Lymphocytes Absolute: 1.7 10*3/uL (ref 0.7–3.1)
Lymphs: 29 %
MCH: 29.8 pg (ref 26.6–33.0)
MCHC: 31.7 g/dL (ref 31.5–35.7)
MCV: 94 fL (ref 79–97)
Monocytes Absolute: 0.8 10*3/uL (ref 0.1–0.9)
Monocytes: 13 %
Neutrophils Absolute: 3 10*3/uL (ref 1.4–7.0)
Neutrophils: 53 %
Platelets: 334 10*3/uL (ref 150–450)
RBC: 4.19 x10E6/uL (ref 3.77–5.28)
RDW: 12.9 % (ref 11.7–15.4)
WBC: 5.7 10*3/uL (ref 3.4–10.8)

## 2023-10-18 LAB — COMPREHENSIVE METABOLIC PANEL
ALT: 33 [IU]/L — ABNORMAL HIGH (ref 0–32)
AST: 31 [IU]/L (ref 0–40)
Albumin: 4.3 g/dL (ref 3.9–4.9)
Alkaline Phosphatase: 97 [IU]/L (ref 44–121)
BUN/Creatinine Ratio: 25 (ref 12–28)
BUN: 17 mg/dL (ref 8–27)
Bilirubin Total: <0.2 mg/dL (ref 0.0–1.2)
CO2: 24 mmol/L (ref 20–29)
Calcium: 9.5 mg/dL (ref 8.7–10.3)
Chloride: 97 mmol/L (ref 96–106)
Creatinine, Ser: 0.67 mg/dL (ref 0.57–1.00)
Globulin, Total: 2.2 g/dL (ref 1.5–4.5)
Glucose: 92 mg/dL (ref 70–99)
Potassium: 4.6 mmol/L (ref 3.5–5.2)
Sodium: 134 mmol/L (ref 134–144)
Total Protein: 6.5 g/dL (ref 6.0–8.5)
eGFR: 98 mL/min/{1.73_m2} (ref >59)

## 2023-10-27 ENCOUNTER — Ambulatory Visit
Admission: RE | Admit: 2023-10-27 | Discharge: 2023-10-27 | Disposition: A | Discharge status: Home / Self Care | Payer: 59 | Source: Ambulatory Visit | Attending: Obstetrics and Gynecology | Admitting: Obstetrics and Gynecology

## 2023-10-27 DIAGNOSIS — Z1231 Encounter for screening mammogram for malignant neoplasm of breast: Secondary | ICD-10-CM | POA: Diagnosis not present

## 2023-12-18 ENCOUNTER — Ambulatory Visit: Payer: 59 | Admitting: Obstetrics and Gynecology

## 2024-09-12 ENCOUNTER — Ambulatory Visit

## 2024-10-22 ENCOUNTER — Other Ambulatory Visit: Payer: Self-pay | Admitting: Obstetrics and Gynecology

## 2024-10-22 DIAGNOSIS — Z1231 Encounter for screening mammogram for malignant neoplasm of breast: Secondary | ICD-10-CM

## 2024-11-20 ENCOUNTER — Other Ambulatory Visit: Payer: Self-pay | Admitting: Obstetrics and Gynecology

## 2024-11-20 ENCOUNTER — Ambulatory Visit
Admission: RE | Admit: 2024-11-20 | Discharge: 2024-11-20 | Disposition: A | Discharge status: Home / Self Care | Source: Ambulatory Visit | Attending: Obstetrics and Gynecology | Admitting: Obstetrics and Gynecology

## 2024-11-20 DIAGNOSIS — Z1231 Encounter for screening mammogram for malignant neoplasm of breast: Secondary | ICD-10-CM | POA: Diagnosis present

## 2024-11-27 ENCOUNTER — Ambulatory Visit: Payer: Self-pay | Admitting: Obstetrics and Gynecology

## 2024-12-23 ENCOUNTER — Encounter: Payer: Self-pay | Admitting: Family Medicine

## 2024-12-23 ENCOUNTER — Ambulatory Visit (INDEPENDENT_AMBULATORY_CARE_PROVIDER_SITE_OTHER): Admitting: Family Medicine

## 2024-12-23 ENCOUNTER — Encounter: Admitting: Family Medicine

## 2024-12-23 VITALS — BP 132/78 | HR 89 | Temp 98.1°F | Resp 18 | Ht 67.0 in | Wt 165.4 lb

## 2024-12-23 DIAGNOSIS — E785 Hyperlipidemia, unspecified: Secondary | ICD-10-CM

## 2024-12-23 DIAGNOSIS — Z131 Encounter for screening for diabetes mellitus: Secondary | ICD-10-CM

## 2024-12-23 DIAGNOSIS — E559 Vitamin D deficiency, unspecified: Secondary | ICD-10-CM

## 2024-12-23 DIAGNOSIS — R7989 Other specified abnormal findings of blood chemistry: Secondary | ICD-10-CM

## 2024-12-23 DIAGNOSIS — Z13820 Encounter for screening for osteoporosis: Secondary | ICD-10-CM

## 2024-12-23 DIAGNOSIS — Z0000 Encounter for general adult medical examination without abnormal findings: Secondary | ICD-10-CM

## 2024-12-23 DIAGNOSIS — R682 Dry mouth, unspecified: Secondary | ICD-10-CM

## 2024-12-23 DIAGNOSIS — Z0001 Encounter for general adult medical examination with abnormal findings: Secondary | ICD-10-CM

## 2024-12-23 NOTE — Progress Notes (Signed)
 "  Complete physical exam  Patient: Amanda Boyd   DOB: [birth year]   63 y.o. Female  MRN: [id number]  Subjective:    Chief Complaint  Patient presents with   Annual Exam    Wants bloodwork for DM check and also wants to discuss symptoms of sjorens syndrome and  if hereditary sister has    Amanda Boyd is a 63 y.o. female who presents today for a complete physical exam. Patient is a pleasant 63 year old female who is a new patient to myself and seen for physical exam, also voicing other concerns as noted below.  She voices overall diet has been ok, but does admit to over eating since her friend passed a couple of months ago. She voices this person was her support person and since she passed she voices she feels as if she does not have any friend. She does have family nearby and feels that they are supportive, but still lacks close friends. Offered referral for counseling, but this is declined. She is tearful during our visit due to recent life stressors including passing of her friend and insurance issues. She generally feels fairly well. She reports sleeping well. She reports typically she takes OTC Melatonin at night and this does well for her sleep. She does have additional problems to discuss today.   She voices her sister has Sjogren's Syndrome and she is curious as to if she could have this well. She voices she has dry skin. She also suffers from dry mouth. She voices she is taking a product called Thrive and she stopped taking this and did not see a difference. She does endorse associated fatigue. She voices the dentist mentioned dry mouth as well. She voices she never feels that her mouth is moist.  She also voices concerns of an awful taste in her throat. She voices this started Labor Day weekend of this year. She correlates this with being bit by an unknown insect/ bug and was treated with antibiotic therapy. She denies heartburn, acid reflux or indigestion. Denies n/v. Denies sore  throat and denies dysphagia.   She reports family hx of diabetes affecting her paternal aunt. She voices she has no new hx of diabetes, however does not recall being screened for diabetes.  She reports she was seen by endocrinology in the past after she was started on Synthroid. She voices endocrinology stopped medication but ordered additional testing due to a thyroid nodule. She voices she will send us  these records.   Most recent fall risk assessment:    12/23/2024   10:40 AM  Fall Risk   Falls in the past year? 0  Number falls in past yr: 0  Injury with Fall? 0  Follow up Falls evaluation completed     Most recent depression screenings:    12/23/2024   10:40 AM 01/11/2022    2:37 PM  PHQ 2/9 Scores  PHQ - 2 Score 0 1  PHQ- 9 Score 0 3      Data saved with a previous flowsheet row definition      Past Medical History:  Diagnosis Date   No pertinent past medical history    Past Surgical History:  Procedure Laterality Date   AUGMENTATION MAMMAPLASTY Bilateral 2001   saline   BREAST ENHANCEMENT SURGERY Bilateral    BREAST LUMPECTOMY Left 2001   benign   CESAREAN SECTION     X2   TONSILLECTOMY     AGE4-5   TUBAL LIGATION  Social History[1]    Patient Care Team: Edith Laymon SAILOR, FNP as PCP - General (Family Medicine) Cindie Jesusa HERO, RN as Registered Nurse Cindie Jesusa HERO, RN as Registered Nurse   Show/hide medication list[2]  Review of Systems  Constitutional:  Positive for malaise/fatigue. Negative for fever and weight loss.  HENT:  Negative for congestion, sinus pain and sore throat.   Respiratory:  Negative for cough and shortness of breath.   Cardiovascular:  Negative for chest pain and palpitations.  Gastrointestinal:  Negative for blood in stool, constipation, diarrhea, heartburn, nausea and vomiting.  Musculoskeletal:  Negative for falls, joint pain and myalgias.  Skin:  Negative for rash.  Neurological:  Negative for dizziness and  headaches.  Psychiatric/Behavioral:  Negative for depression. The patient is nervous/anxious. The patient does not have insomnia.           Objective:     BP 132/78   Pulse 89   Temp 98.1 F (36.7 C)   Resp 18   Ht 5' 7 (1.702 m)   Wt 165 lb 6.4 oz (75 kg)   SpO2 98%   BMI 25.91 kg/m  BP Readings from Last 3 Encounters:  12/23/24 132/78  10/17/23 108/70  02/03/22 110/70   Wt Readings from Last 3 Encounters:  12/23/24 165 lb 6.4 oz (75 kg)  10/17/23 159 lb (72.1 kg)  02/03/22 157 lb (71.2 kg)      Physical Exam Constitutional:      Appearance: Normal appearance. She is normal weight.  HENT:     Head: Normocephalic and atraumatic.     Right Ear: Tympanic membrane, ear canal and external ear normal.     Left Ear: Tympanic membrane, ear canal and external ear normal.     Nose: No signs of injury, laceration, nasal tenderness, congestion or rhinorrhea.     Right Turbinates: Not enlarged, swollen or pale.     Left Turbinates: Swollen.     Right Sinus: Maxillary sinus tenderness present.     Left Sinus: Maxillary sinus tenderness present.     Mouth/Throat:     Pharynx: Oropharynx is clear. No oropharyngeal exudate or posterior oropharyngeal erythema.  Eyes:     Conjunctiva/sclera: Conjunctivae normal.     Pupils: Pupils are equal, round, and reactive to light.  Neck:     Vascular: No carotid bruit.  Cardiovascular:     Rate and Rhythm: Normal rate and regular rhythm.     Heart sounds: Normal heart sounds.  Pulmonary:     Effort: Pulmonary effort is normal.     Breath sounds: Normal breath sounds. No wheezing, rhonchi or rales.  Abdominal:     General: Abdomen is flat. There is no distension.     Palpations: Abdomen is soft.     Tenderness: There is no abdominal tenderness.  Musculoskeletal:     Cervical back: Normal range of motion and neck supple. No tenderness.     Right lower leg: No edema.     Left lower leg: No edema.  Lymphadenopathy:     Cervical:  No cervical adenopathy.  Skin:    General: Skin is warm and dry.  Neurological:     General: No focal deficit present.     Mental Status: She is alert and oriented to person, place, and time.  Psychiatric:        Attention and Perception: Attention normal.        Mood and Affect: Mood is anxious. Affect is tearful.  Speech: Speech normal.        Behavior: Behavior is cooperative.      No results found for any visits on 12/23/24. Last CBC Lab Results  Component Value Date   WBC 5.7 10/17/2023   HGB 12.5 10/17/2023   HCT 39.4 10/17/2023   MCV 94 10/17/2023   MCH 29.8 10/17/2023   RDW 12.9 10/17/2023   PLT 334 10/17/2023   Last metabolic panel Lab Results  Component Value Date   GLUCOSE 92 10/17/2023   NA 134 10/17/2023   K 4.6 10/17/2023   CL 97 10/17/2023   CO2 24 10/17/2023   BUN 17 10/17/2023   CREATININE 0.67 10/17/2023   EGFR 98 10/17/2023   CALCIUM 9.5 10/17/2023   PROT 6.5 10/17/2023   ALBUMIN 4.3 10/17/2023   LABGLOB 2.2 10/17/2023   AGRATIO 2.0 11/07/2018   BILITOT <0.2 10/17/2023   ALKPHOS 97 10/17/2023   AST 31 10/17/2023   ALT 33 (H) 10/17/2023   Last lipids Lab Results  Component Value Date   CHOL 185 11/07/2018   HDL 59 11/07/2018   LDLCALC 113 (H) 11/07/2018   TRIG 63 11/07/2018   CHOLHDL 3.1 11/07/2018   Last hemoglobin A1c Lab Results  Component Value Date   HGBA1C 5.4 11/07/2018   Last thyroid functions Lab Results  Component Value Date   TSH 4.440 11/07/2018         Assessment & Plan:    Routine Health Maintenance and Physical Exam  Immunization History  Administered Date(s) Administered   Influenza Inj Mdck Quad Pf 10/04/2018   Influenza,inj,Quad PF,6+ Mos 10/05/2017, 01/11/2022   Influenza-Unspecified 10/23/2019, 11/02/2020   Janssen (J&J) SARS-COV-2 Vaccination 05/27/2020   Moderna SARS-COV2 Booster Vaccination 11/09/2020   Moderna Sars-Covid-2 Vaccination 11/09/2020   Tdap 11/01/2017   Zoster  Recombinant(Shingrix ) 11/07/2018, 01/09/2019    Health Maintenance  Topic Date Due   Pneumococcal Vaccine: 50+ Years (1 of 2 - PCV) Never done   COVID-19 Vaccine (3 - 2025-26 season) 08/26/2024   Bone Density Scan  Never done   Influenza Vaccine  03/25/2025 (Originally 07/26/2024)   Fecal DNA (Cologuard)  12/26/2024   Mammogram  11/20/2026   Cervical Cancer Screening (HPV/Pap Cotest)  02/03/2027   DTaP/Tdap/Td (2 - Td or Tdap) 11/02/2027   Hepatitis C Screening  Completed   HIV Screening  Completed   Zoster Vaccines- Shingrix   Completed   Hepatitis B Vaccines 19-59 Average Risk  Aged Out   Meningococcal B Vaccine  Aged Out    Discussed health benefits of physical activity, and encouraged her to engage in regular exercise appropriate for her age and condition.  Assessment & Plan Annual physical exam Patient is a 63 year old female seen for her annual physical exam. She has other concerns as noted below.  Orders:   CBC w/Diff/Platelet   Comprehensive Metabolic Panel (CMET)   HgB A1c  Healthcare maintenance -CRC screening, unsure if UTD. Last colonoscopy in [place]  with [facility] and she reports this was normal. Recommended 10 year follow up. Will attempt to request records. -Pap UTD, NIL 01/2022 -DEXA ordered today -Mammogram UTD, negative 10/2024 Orders:   HgB A1c  Screening for osteoporosis DEXA ordered today. No known hx of pathological fracture and patient denies recent falls. Orders:   DG Bone Density; Future  Dry mouth Patient is a 63 year old female seen voicing complaints of dry mouth and associates this with a foul taste in her throat. She also endorses complaints of dry  eyes. She mentions that she believes her sister has Sjogren's disease, however unsure if there are other autoimmune conditions too. Due to family hx and patient experienced symptoms she is interested in Sjogren's testing.   -Labs ordered -Discussed if test is positive will refer to  rheumatology -If test is negative we discussed potentially a referral to GI due to concerns of foul taste in the mouth and sensation as if something is stuck. Discuss more at 6 week f/u pending labs.  -Discussed potential CT sinus due to maxillary sinus tenderness and sensation that something is in her throat and patient reports concern of bad breath. Discuss more at 6 week f/u pending labs Orders:   Antinuclear Antib (ANA)   Sjogrens syndrome-A extractable nuclear antibody   Sjogrens syndrome-B extractable nuclear antibody   TSH  Vitamin D deficiency Reports hx of vitamin D deficiency. No recent lab available. Orders:   Vitamin D (25 hydroxy)  Hyperlipidemia, unspecified hyperlipidemia type Hyperlipidemia previously uncontrolled. Last LDL 113 in 10/2018.  -Lipid panel updated today Orders:   Lipid Profile  Screening for diabetes mellitus She is worried about potential diabetes diagnosis due to family hx of paternal aunt with diabetes. She would like to be screened for diabetes.  -A1c ordered Orders:   HgB A1c  Abnormal TSH Hx of abnormal TSH and patient reports she was started on thyroid medication, believed to be Synthroid As per HPI she states she saw endocrinology and was told that she did not need medication. She voices they also found something in the thyroid region, I will assume a thyroid nodule, however she voices workup was without significant findings and did not need additional intervention or follow up. She voices she will try to get records to us .   -TSH ordered -Consider thyroid ultrasound, plan to discuss at 6 week f/u  Orders:   TSH     Return in about 6 weeks (around 02/03/2025).     Donis Kotowski N Tanisha Lutes, FNP      [1]  Social History Tobacco Use   Smoking status: Former    Current packs/day: 0.00    Types: Cigarettes    Quit date: 12/26/1985    Years since quitting: 39.0   Smokeless tobacco: Never  Vaping Use   Vaping status: Never Used  Substance  Use Topics   Alcohol use: No   Drug use: No  [2]  No outpatient medications prior to visit.   No facility-administered medications prior to visit.   "

## 2024-12-24 ENCOUNTER — Ambulatory Visit: Payer: Self-pay | Admitting: Family Medicine

## 2024-12-24 DIAGNOSIS — E559 Vitamin D deficiency, unspecified: Secondary | ICD-10-CM

## 2024-12-24 DIAGNOSIS — R7689 Other specified abnormal immunological findings in serum: Secondary | ICD-10-CM

## 2024-12-24 DIAGNOSIS — R5383 Other fatigue: Secondary | ICD-10-CM

## 2024-12-24 MED ORDER — VITAMIN D (ERGOCALCIFEROL) 1.25 MG (50000 UNIT) PO CAPS: 50000.0000 [IU] | ORAL_CAPSULE | ORAL | 0 refills | Status: AC

## 2024-12-27 LAB — CBC WITH DIFFERENTIAL/PLATELET
Absolute Lymphocytes: 1268 {cells}/uL (ref 850–3900)
Absolute Monocytes: 584 {cells}/uL (ref 200–950)
Basophils Absolute: 29 {cells}/uL (ref 0–200)
Basophils Relative: 0.7 %
Eosinophils Absolute: 71 {cells}/uL (ref 15–500)
Eosinophils Relative: 1.7 %
HCT: 40.6 % (ref 35.9–46.0)
Hemoglobin: 13.2 g/dL (ref 11.7–15.5)
MCH: 30.3 pg (ref 27.0–33.0)
MCHC: 32.5 g/dL (ref 31.6–35.4)
MCV: 93.3 fL (ref 81.4–101.7)
MPV: 9.6 fL (ref 7.5–12.5)
Monocytes Relative: 13.9 %
Neutro Abs: 2247 {cells}/uL (ref 1500–7800)
Neutrophils Relative %: 53.5 %
Platelets: 301 Thousand/uL (ref 140–400)
RBC: 4.35 Million/uL (ref 3.80–5.10)
RDW: 11.8 % (ref 11.0–15.0)
Total Lymphocyte: 30.2 %
WBC: 4.2 Thousand/uL (ref 3.8–10.8)

## 2024-12-27 LAB — COMPREHENSIVE METABOLIC PANEL WITH GFR
AG Ratio: 2.1 (calc) (ref 1.0–2.5)
ALT: 39 U/L — ABNORMAL HIGH (ref 6–29)
AST: 33 U/L (ref 10–35)
Albumin: 4.7 g/dL (ref 3.6–5.1)
Alkaline phosphatase (APISO): 84 U/L (ref 37–153)
BUN: 12 mg/dL (ref 7–25)
CO2: 27 mmol/L (ref 20–32)
Calcium: 9.7 mg/dL (ref 8.6–10.4)
Chloride: 105 mmol/L (ref 98–110)
Creat: 0.6 mg/dL (ref 0.50–1.05)
Globulin: 2.2 g/dL (ref 1.9–3.7)
Glucose, Bld: 103 mg/dL (ref 65–139)
Potassium: 4 mmol/L (ref 3.5–5.3)
Sodium: 140 mmol/L (ref 135–146)
Total Bilirubin: 0.3 mg/dL (ref 0.2–1.2)
Total Protein: 6.9 g/dL (ref 6.1–8.1)
eGFR: 100 mL/min/1.73m2

## 2024-12-27 LAB — LIPID PANEL
Cholesterol: 199 mg/dL
HDL: 62 mg/dL
LDL Cholesterol (Calc): 117 mg/dL — ABNORMAL HIGH
Non-HDL Cholesterol (Calc): 137 mg/dL — ABNORMAL HIGH
Total CHOL/HDL Ratio: 3.2 (calc)
Triglycerides: 94 mg/dL

## 2024-12-27 LAB — ANTI-NUCLEAR AB-TITER (ANA TITER): ANA Titer 1: 1:320 {titer} — ABNORMAL HIGH

## 2024-12-27 LAB — SJOGRENS SYNDROME-B EXTRACTABLE NUCLEAR ANTIBODY: SSB (La) (ENA) Antibody, IgG: <1 AI

## 2024-12-27 LAB — ANA: Anti Nuclear Antibody (ANA): POSITIVE — AB

## 2024-12-27 LAB — VITAMIN D 25 HYDROXY (VIT D DEFICIENCY, FRACTURES): Vit D, 25-Hydroxy: 22 ng/mL — ABNORMAL LOW (ref 30–100)

## 2024-12-27 LAB — TSH: TSH: 2.78 m[IU]/L (ref 0.40–4.50)

## 2024-12-27 LAB — HEMOGLOBIN A1C
Hgb A1c MFr Bld: 5.5 %
Mean Plasma Glucose: 111 mg/dL
eAG (mmol/L): 6.2 mmol/L

## 2024-12-27 LAB — SJOGRENS SYNDROME-A EXTRACTABLE NUCLEAR ANTIBODY: SSA (Ro) (ENA) Antibody, IgG: <1 AI

## 2025-01-01 ENCOUNTER — Telehealth: Payer: Self-pay | Admitting: Family Medicine

## 2025-01-01 ENCOUNTER — Telehealth: Payer: Self-pay

## 2025-01-01 ENCOUNTER — Other Ambulatory Visit: Payer: Self-pay | Admitting: Family Medicine

## 2025-01-01 DIAGNOSIS — Z8639 Personal history of other endocrine, nutritional and metabolic disease: Secondary | ICD-10-CM

## 2025-01-01 NOTE — Telephone Encounter (Signed)
 Hx of thyroid nodule. Thyroid ultrasound ordered for follow up.

## 2025-01-01 NOTE — Telephone Encounter (Signed)
 Called patient and notified. Appointment rescheduled for 04/09/25

## 2025-01-01 NOTE — Telephone Encounter (Signed)
 Copied from CRM [id number]. Topic: Clinical - Medical Advice >> Jan 01, 2025  9:36 AM Nessti S wrote: Reason for CRM: pt called because she wanted to know if she needed to move pcp appt closer to the rheumatology appt. She would like a call back soon as possible.

## 2025-01-08 ENCOUNTER — Ambulatory Visit (HOSPITAL_COMMUNITY)
Admission: RE | Admit: 2025-01-08 | Discharge: 2025-01-08 | Disposition: A | Discharge status: Home / Self Care | Source: Ambulatory Visit | Attending: Family Medicine | Admitting: Family Medicine

## 2025-01-08 DIAGNOSIS — Z13820 Encounter for screening for osteoporosis: Secondary | ICD-10-CM | POA: Diagnosis present

## 2025-01-08 DIAGNOSIS — Z8639 Personal history of other endocrine, nutritional and metabolic disease: Secondary | ICD-10-CM | POA: Insufficient documentation

## 2025-01-09 ENCOUNTER — Ambulatory Visit: Payer: Self-pay | Admitting: Family Medicine

## 2025-01-09 DIAGNOSIS — E041 Nontoxic single thyroid nodule: Secondary | ICD-10-CM

## 2025-02-03 ENCOUNTER — Ambulatory Visit: Admitting: Family Medicine

## 2025-04-02 ENCOUNTER — Ambulatory Visit: Admitting: Internal Medicine

## 2025-04-09 ENCOUNTER — Ambulatory Visit: Admitting: Family Medicine

## 2025-06-04 ENCOUNTER — Ambulatory Visit: Admitting: Endocrinology
